# Patient Record
Sex: Male | Born: 1960 | Race: White | Hispanic: No | Marital: Married | Smoking: Never smoker
Health system: Southern US, Community
[De-identification: ages and names within clinical notes are randomized; demographics above are authoritative.]

## PROBLEM LIST (undated history)

## (undated) DIAGNOSIS — Z860100 Personal history of colon polyps, unspecified: Secondary | ICD-10-CM

## (undated) DIAGNOSIS — T7840XA Allergy, unspecified, initial encounter: Secondary | ICD-10-CM

## (undated) DIAGNOSIS — M1990 Unspecified osteoarthritis, unspecified site: Secondary | ICD-10-CM

## (undated) DIAGNOSIS — J069 Acute upper respiratory infection, unspecified: Secondary | ICD-10-CM

## (undated) DIAGNOSIS — E7800 Pure hypercholesterolemia, unspecified: Secondary | ICD-10-CM

## (undated) DIAGNOSIS — C801 Malignant (primary) neoplasm, unspecified: Secondary | ICD-10-CM

## (undated) HISTORY — DX: Malignant (primary) neoplasm, unspecified: C80.1

## (undated) HISTORY — PX: COLONOSCOPY: SHX174

## (undated) HISTORY — DX: Personal history of colon polyps, unspecified: Z86.0100

## (undated) HISTORY — DX: Personal history of colonic polyps: Z86.010

## (undated) HISTORY — DX: Acute upper respiratory infection, unspecified: J06.9

## (undated) HISTORY — DX: Allergy, unspecified, initial encounter: T78.40XA

## (undated) HISTORY — DX: Pure hypercholesterolemia, unspecified: E78.00

## (undated) HISTORY — PX: POLYPECTOMY: SHX149

## (undated) HISTORY — DX: Unspecified osteoarthritis, unspecified site: M19.90

---

## 1998-01-27 ENCOUNTER — Ambulatory Visit (HOSPITAL_COMMUNITY): Admission: RE | Admit: 1998-01-27 | Discharge: 1998-01-27 | Payer: Self-pay | Admitting: Neurosurgery

## 2000-12-04 ENCOUNTER — Encounter: Payer: Self-pay | Admitting: Orthopedic Surgery

## 2000-12-04 ENCOUNTER — Ambulatory Visit (HOSPITAL_COMMUNITY): Admission: RE | Admit: 2000-12-04 | Discharge: 2000-12-04 | Payer: Self-pay | Admitting: Orthopedic Surgery

## 2000-12-19 ENCOUNTER — Encounter: Admission: RE | Admit: 2000-12-19 | Discharge: 2000-12-19 | Payer: Self-pay | Admitting: Orthopedic Surgery

## 2000-12-19 ENCOUNTER — Encounter: Payer: Self-pay | Admitting: Orthopedic Surgery

## 2001-01-01 ENCOUNTER — Encounter: Admission: RE | Admit: 2001-01-01 | Discharge: 2001-01-01 | Payer: Self-pay | Admitting: Orthopedic Surgery

## 2001-01-01 ENCOUNTER — Encounter: Payer: Self-pay | Admitting: Orthopedic Surgery

## 2003-09-30 ENCOUNTER — Ambulatory Visit (HOSPITAL_COMMUNITY): Admission: RE | Admit: 2003-09-30 | Discharge: 2003-09-30 | Payer: Self-pay | Admitting: Orthopedic Surgery

## 2004-09-16 HISTORY — PX: ACHILLES TENDON REPAIR: SUR1153

## 2011-12-06 ENCOUNTER — Encounter: Payer: Self-pay | Admitting: Internal Medicine

## 2012-01-20 ENCOUNTER — Ambulatory Visit (AMBULATORY_SURGERY_CENTER): Payer: PRIVATE HEALTH INSURANCE | Admitting: *Deleted

## 2012-01-20 ENCOUNTER — Encounter: Payer: Self-pay | Admitting: Internal Medicine

## 2012-01-20 VITALS — Ht 71.0 in | Wt 183.0 lb

## 2012-01-20 DIAGNOSIS — Z1211 Encounter for screening for malignant neoplasm of colon: Secondary | ICD-10-CM

## 2012-01-20 MED ORDER — PEG-KCL-NACL-NASULF-NA ASC-C 100 G PO SOLR: ORAL | Status: DC

## 2012-02-03 ENCOUNTER — Encounter: Payer: Self-pay | Admitting: Internal Medicine

## 2012-03-12 ENCOUNTER — Ambulatory Visit (AMBULATORY_SURGERY_CENTER): Payer: PRIVATE HEALTH INSURANCE | Admitting: Internal Medicine

## 2012-03-12 ENCOUNTER — Encounter: Payer: Self-pay | Admitting: Internal Medicine

## 2012-03-12 VITALS — BP 117/69 | HR 63 | Temp 96.8°F | Resp 17 | Ht 71.0 in | Wt 183.0 lb

## 2012-03-12 DIAGNOSIS — D126 Benign neoplasm of colon, unspecified: Secondary | ICD-10-CM

## 2012-03-12 DIAGNOSIS — Z1211 Encounter for screening for malignant neoplasm of colon: Secondary | ICD-10-CM

## 2012-03-12 MED ORDER — SODIUM CHLORIDE 0.9 % IV SOLN: 500.0000 mL | INTRAVENOUS | Status: DC

## 2012-03-12 NOTE — Patient Instructions (Addendum)
YOU HAD AN ENDOSCOPIC PROCEDURE TODAY AT THE [facility]: Refer to the procedure report that was given to you for any specific questions about what was found during the examination.  If the procedure report does not answer your questions, please call your gastroenterologist to clarify.  If you requested that your care partner not be given the details of your procedure findings, then the procedure report has been included in a sealed envelope for you to review at your convenience later.  YOU SHOULD EXPECT: Some feelings of bloating in the abdomen. Passage of more gas than usual.  Walking can help get rid of the air that was put into your GI tract during the procedure and reduce the bloating. If you had a lower endoscopy (such as a colonoscopy or flexible sigmoidoscopy) you may notice spotting of blood in your stool or on the toilet paper. If you underwent a bowel prep for your procedure, then you may not have a normal bowel movement for a few days.  DIET: Your first meal following the procedure should be a light meal and then it is ok to progress to your normal diet.  A half-sandwich or bowl of soup is an example of a good first meal.  Heavy or fried foods are harder to digest and may make you feel nauseous or bloated.  Likewise meals heavy in dairy and vegetables can cause extra gas to form and this can also increase the bloating.  Drink plenty of fluids but you should avoid alcoholic beverages for 24 hours.  ACTIVITY: Your care partner should take you home directly after the procedure.  You should plan to take it easy, moving slowly for the rest of the day.  You can resume normal activity the day after the procedure however you should NOT DRIVE or use heavy machinery for 24 hours (because of the sedation medicines used during the test).    SYMPTOMS TO REPORT IMMEDIATELY: A gastroenterologist can be reached at any hour.  During normal business hours, 8:30 AM to 5:00 PM Monday through Friday,  call [phone].  After hours and on weekends, please call the GI answering service at [phone] who will take a message and have the physician on call contact you.   Following lower endoscopy (colonoscopy or flexible sigmoidoscopy):  Excessive amounts of blood in the stool  Significant tenderness or worsening of abdominal pains  Swelling of the abdomen that is new, acute  Fever of 100F or higher  Following upper endoscopy (EGD)  Vomiting of blood or coffee ground material  New chest pain or pain under the shoulder blades  Painful or persistently difficult swallowing  New shortness of breath  Fever of 100F or higher  Black, tarry-looking stools  FOLLOW UP: If any biopsies were taken you will be contacted by phone or by letter within the next 1-3 weeks.  Call your gastroenterologist if you have not heard about the biopsies in 3 weeks.  Our staff will call the home number listed on your records the next business day following your procedure to check on you and address any questions or concerns that you may have at that time regarding the information given to you following your procedure. This is a courtesy call and so if there is no answer at the home number and we have not heard from you through the emergency physician on call, we will assume that you have returned to your regular daily activities without incident.  SIGNATURES/CONFIDENTIALITY: You and/or your care   partner have signed paperwork which will be entered into your electronic medical record.  These signatures attest to the fact that that the information above on your After Visit Summary has been reviewed and is understood.  Full responsibility of the confidentiality of this discharge information lies with you and/or your care-partner.   Handouts on polyps, diverticulosis, high fiber diet  

## 2012-03-12 NOTE — Progress Notes (Signed)
Patient did not experience any of the following events: a burn prior to discharge; a fall within the facility; wrong site/side/patient/procedure/implant event; or a hospital transfer or hospital admission upon discharge from the facility. (G8907) Patient did not have preoperative order for IV antibiotic SSI prophylaxis. (G8918)  

## 2012-03-12 NOTE — Op Note (Signed)
[facility] [address] [place], [place]  [address]  COLONOSCOPY PROCEDURE REPORT  PATIENT:  Wesley Simmons, Wesley Simmons  MR#:  [id number] BIRTHDATE:  05-Jan-1961, 50 yrs. old  GENDER:  male ENDOSCOPIST:  Wilhemina Bonito. Eda Keys, MD REF. BY:  Geoffry Paradise, M.D. PROCEDURE DATE:  03/12/2012 PROCEDURE:  Colonoscopy with snare polypectomy x 1 ASA CLASS:  Class I INDICATIONS:  Routine Risk Screening MEDICATIONS:   MAC sedation, administered by CRNA, propofol (Diprivan) 400 mg IV  DESCRIPTION OF PROCEDURE:   After the risks benefits and alternatives of the procedure were thoroughly explained, informed consent was obtained.  Digital rectal exam was performed and revealed no abnormalities.   The LB CF-[id number] endoscope was introduced through the anus and advanced to the cecum, which was identified by both the appendix and ileocecal valve, without limitations.  The quality of the prep was excellent, using MoviPrep.  The instrument was then slowly withdrawn as the colon was fully examined. <<PROCEDUREIMAGES>>  FINDINGS:  A diminutive polyp was found in the ascending colon and snared without cautery. Retrieval was successful. A few scattered diverticula were found.  Otherwise normal colonoscopy without other polyps, masses, vascular ectasias, or inflammatory changes. Retroflexed views in the rectum revealed no abnormalities.    The time to cecum = 3:34  minutes. The scope was then withdrawn in 10:34  minutes from the cecum and the procedure completed.  COMPLICATIONS:  None  ENDOSCOPIC IMPRESSION: 1) Diminutive polyp in the ascending colon - removed 2) Diverticula, a few scattered 3) Otherwise normal colonoscopy  RECOMMENDATIONS: 1) Repeat colonoscopy in 5 years if polyp adenomatous; otherwise 10 years  ______________________________ Wilhemina Bonito. Eda Keys, MD  CC:  Geoffry Paradise, MD;  The Patient  n. eSIGNED:   Wilhemina Bonito. Eda Keys at 03/12/2012 08:50 AM  Maeder, Fayrene Fearing,  [phone]

## 2012-03-13 ENCOUNTER — Telehealth: Payer: Self-pay | Admitting: *Deleted

## 2012-03-13 NOTE — Telephone Encounter (Signed)
  Follow up Call-  Call back number 03/12/2012  Post procedure Call Back phone  # [phone]  Permission to leave phone message Yes    [facility]

## 2012-03-17 ENCOUNTER — Encounter: Payer: Self-pay | Admitting: Internal Medicine

## 2014-01-19 ENCOUNTER — Other Ambulatory Visit (HOSPITAL_COMMUNITY): Payer: Self-pay | Admitting: Orthopedic Surgery

## 2014-01-19 DIAGNOSIS — M5410 Radiculopathy, site unspecified: Secondary | ICD-10-CM

## 2014-01-19 DIAGNOSIS — M21372 Foot drop, left foot: Secondary | ICD-10-CM

## 2014-01-21 ENCOUNTER — Ambulatory Visit (HOSPITAL_COMMUNITY)
Admission: RE | Admit: 2014-01-21 | Discharge: 2014-01-21 | Disposition: A | Discharge status: Home / Self Care | Payer: PRIVATE HEALTH INSURANCE | Source: Ambulatory Visit | Attending: Orthopedic Surgery | Admitting: Orthopedic Surgery

## 2014-01-21 DIAGNOSIS — M51379 Other intervertebral disc degeneration, lumbosacral region without mention of lumbar back pain or lower extremity pain: Secondary | ICD-10-CM | POA: Insufficient documentation

## 2014-01-21 DIAGNOSIS — M545 Low back pain, unspecified: Secondary | ICD-10-CM | POA: Insufficient documentation

## 2014-01-21 DIAGNOSIS — M21372 Foot drop, left foot: Secondary | ICD-10-CM

## 2014-01-21 DIAGNOSIS — M5126 Other intervertebral disc displacement, lumbar region: Secondary | ICD-10-CM | POA: Insufficient documentation

## 2014-01-21 DIAGNOSIS — M5137 Other intervertebral disc degeneration, lumbosacral region: Secondary | ICD-10-CM | POA: Insufficient documentation

## 2014-01-21 DIAGNOSIS — M5410 Radiculopathy, site unspecified: Secondary | ICD-10-CM

## 2014-01-21 DIAGNOSIS — M5380 Other specified dorsopathies, site unspecified: Secondary | ICD-10-CM | POA: Insufficient documentation

## 2014-01-21 DIAGNOSIS — M48061 Spinal stenosis, lumbar region without neurogenic claudication: Secondary | ICD-10-CM | POA: Insufficient documentation

## 2014-01-21 DIAGNOSIS — R209 Unspecified disturbances of skin sensation: Secondary | ICD-10-CM | POA: Insufficient documentation

## 2016-11-25 ENCOUNTER — Other Ambulatory Visit (INDEPENDENT_AMBULATORY_CARE_PROVIDER_SITE_OTHER): Payer: Self-pay | Admitting: Orthopedic Surgery

## 2016-11-25 MED ORDER — PREDNISONE 10 MG PO TABS: 20.0000 mg | ORAL_TABLET | Freq: Every day | ORAL | 0 refills | Status: DC

## 2016-12-02 ENCOUNTER — Ambulatory Visit (INDEPENDENT_AMBULATORY_CARE_PROVIDER_SITE_OTHER): Payer: PRIVATE HEALTH INSURANCE

## 2016-12-02 ENCOUNTER — Ambulatory Visit (INDEPENDENT_AMBULATORY_CARE_PROVIDER_SITE_OTHER): Payer: PRIVATE HEALTH INSURANCE | Admitting: Family

## 2016-12-02 ENCOUNTER — Encounter (INDEPENDENT_AMBULATORY_CARE_PROVIDER_SITE_OTHER): Payer: Self-pay | Admitting: Orthopedic Surgery

## 2016-12-02 VITALS — Ht 71.0 in | Wt 183.0 lb

## 2016-12-02 DIAGNOSIS — M542 Cervicalgia: Secondary | ICD-10-CM | POA: Diagnosis not present

## 2016-12-02 NOTE — Progress Notes (Signed)
   Office Visit Note   Patient: Wesley Simmons           Date of Birth: [birth year]           MRN: [id number] Visit Date: 12/02/2016              Requested by: Burnard Bunting, MD [address] [place], [place] [address] PCP: Geoffery Lyons, MD  Chief Complaint  Patient presents with  . Neck - Pain    HPI: Patient states that he has been having neck pain "for a while" and that the pain is getting more intense and now has gone from radiating down the left side of his neck to his shoulder to now running down his arm to his index finger. Pamella Pert, RMA  The patient is a 56 year old gentleman who presents today with 3 weeks of worsening neck pain. This has been getting more intense. Has radicular pain down the left side of his neck shoulder down to his arm and index finger. This previously trialed prednisone with minimal relief at the start which has runoff. States pain is worsening. States feels like his left hand is weak.    Assessment & Plan: Visit Diagnoses:  1. Neck pain   2. Cervicalgia     Plan: Will proceed with MRI c spine. Follow up for review.  Follow-Up Instructions: Return for p mri c duda.   Back Exam   Tenderness  Back tenderness location: Cervical spine nontender.  Range of Motion  The patient has normal back ROM.  Comments:  Him pain with forward flexion as well as extension of the neck. Rotation and lateral movements not painful. spurling negative.       Imaging: No results found.  Labs: No results found for: HGBA1C, ESRSEDRATE, CRP, LABURIC, REPTSTATUS, GRAMSTAIN, CULT, LABORGA  Orders:  Orders Placed This Encounter  Procedures  . XR Cervical Spine 2 or 3 views  . MR Cervical Spine w/o contrast   No orders of the defined types were placed in this encounter.    Procedures: No procedures performed  Clinical Data: No additional findings.  Subjective: Review of Systems  Constitutional: Negative for chills and fever.    Musculoskeletal: Positive for neck pain. Negative for neck stiffness.  Neurological: Positive for weakness and numbness.    Objective: Vital Signs: Ht 5\' 11"  (1.803 m)   Wt 183 lb (83 kg)   BMI 25.52 kg/m   Specialty Comments:  No specialty comments available.  PMFS History: There are no active problems to display for this patient.  Past Medical History:  Diagnosis Date  . 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  Past Surgical History:  Procedure Laterality Date  . ACHILLES TENDON REPAIR  2006   right   Social History   Occupational History  . Not on file.   Social History Main Topics  . Smoking status: Never Smoker  . Smokeless tobacco: Never Used  . Alcohol use 1.8 oz/week    2 Glasses of wine, 1 Cans of beer per week  . Drug use: No  . Sexual activity: Not on file

## 2016-12-13 ENCOUNTER — Other Ambulatory Visit: Payer: PRIVATE HEALTH INSURANCE

## 2016-12-29 ENCOUNTER — Ambulatory Visit
Admission: RE | Admit: 2016-12-29 | Discharge: 2016-12-29 | Disposition: A | Discharge status: Home / Self Care | Payer: PRIVATE HEALTH INSURANCE | Source: Ambulatory Visit | Attending: Family | Admitting: Family

## 2016-12-29 DIAGNOSIS — M542 Cervicalgia: Secondary | ICD-10-CM

## 2016-12-30 ENCOUNTER — Encounter: Payer: Self-pay | Admitting: Internal Medicine

## 2016-12-30 ENCOUNTER — Telehealth (INDEPENDENT_AMBULATORY_CARE_PROVIDER_SITE_OTHER): Payer: Self-pay | Admitting: Orthopedic Surgery

## 2016-12-30 NOTE — Telephone Encounter (Signed)
Called patient left message on voicemail to return call to schedule appointment for MRI review   [phone]

## 2017-01-06 ENCOUNTER — Encounter (INDEPENDENT_AMBULATORY_CARE_PROVIDER_SITE_OTHER): Payer: Self-pay | Admitting: Orthopedic Surgery

## 2017-01-06 ENCOUNTER — Ambulatory Visit (INDEPENDENT_AMBULATORY_CARE_PROVIDER_SITE_OTHER): Payer: PRIVATE HEALTH INSURANCE | Admitting: Orthopedic Surgery

## 2017-01-06 VITALS — Ht 71.0 in | Wt 183.0 lb

## 2017-01-06 DIAGNOSIS — M4722 Other spondylosis with radiculopathy, cervical region: Secondary | ICD-10-CM

## 2017-01-06 DIAGNOSIS — M4712 Other spondylosis with myelopathy, cervical region: Secondary | ICD-10-CM

## 2017-01-06 NOTE — Progress Notes (Signed)
   Office Visit Note   Patient: Wesley Simmons           Date of Birth: [birth year]           MRN: [id number] Visit Date: 01/06/2017              Requested by: Burnard Bunting, MD [address] [place], [place] [address] PCP: Geoffery Lyons, MD  Chief Complaint  Patient presents with  . Neck - Follow-up    C spine MRI review      HPI: Patient is a 56 year old gentleman who still has radicular pain left upper extremity he states sometimes he notices some fatigue weakness in his grip strength left hand his radicular pain that radiates down to his index finger left upper extremity. Patient has tried a prednisone Dosepak as well as traction.  Assessment & Plan: Visit Diagnoses:  1. 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 Ernestina Patches. Again recommended the importance of traction.   Follow-Up Instructions: Return if symptoms worsen or fail to improve.   Ortho Exam  Patient is alert, oriented, no adenopathy, well-dressed, normal affect, normal respiratory effort. On examination patient has a normal gait. He has decreased range of motion of the cervical spine. Patient has no focal motor weakness in either upper extremity but he does report some fatigue weakness in the left hand grip strength and radicular pain down his left index finger.  Imaging: No results found.  Labs: No results found for: HGBA1C, ESRSEDRATE, CRP, LABURIC, REPTSTATUS, GRAMSTAIN, CULT, LABORGA  Orders:  Orders Placed This Encounter  Procedures  . Ambulatory referral to Physical Medicine Rehab   No orders of the defined types were placed in this encounter.    Procedures: No procedures performed  Clinical Data: No additional findings.  ROS:  All other systems negative, except as noted in the HPI. Review of Systems  Objective: Vital Signs: Ht 5\' 11"  (1.803 m)   Wt 183 lb (83 kg)   BMI 25.52 kg/m    Specialty Comments:  No specialty comments available.  PMFS History: Patient Active Problem List   Diagnosis Date Noted  . 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  Past Surgical History:  Procedure Laterality Date  . ACHILLES TENDON REPAIR  2006   right   Social History   Occupational History  . Not on file.   Social History Main Topics  . Smoking status: Never Smoker  . Smokeless tobacco: Never Used  . Alcohol use 1.8 oz/week    2 Glasses of wine, 1 Cans of beer per week  . Drug use: No  . Sexual activity: Not on file

## 2017-01-30 ENCOUNTER — Other Ambulatory Visit (INDEPENDENT_AMBULATORY_CARE_PROVIDER_SITE_OTHER): Payer: Self-pay

## 2017-01-30 DIAGNOSIS — M542 Cervicalgia: Secondary | ICD-10-CM

## 2017-03-05 ENCOUNTER — Encounter (INDEPENDENT_AMBULATORY_CARE_PROVIDER_SITE_OTHER): Payer: Self-pay

## 2017-08-16 DIAGNOSIS — J069 Acute upper respiratory infection, unspecified: Secondary | ICD-10-CM

## 2017-08-16 HISTORY — DX: Acute upper respiratory infection, unspecified: J06.9

## 2017-08-22 ENCOUNTER — Encounter: Payer: Self-pay | Admitting: Internal Medicine

## 2017-09-03 ENCOUNTER — Other Ambulatory Visit: Payer: Self-pay

## 2017-09-03 ENCOUNTER — Ambulatory Visit (AMBULATORY_SURGERY_CENTER): Payer: Self-pay | Admitting: *Deleted

## 2017-09-03 VITALS — Ht 71.0 in | Wt 183.0 lb

## 2017-09-03 DIAGNOSIS — Z86010 Personal history of colonic polyps: Secondary | ICD-10-CM

## 2017-09-03 MED ORDER — NA SULFATE-K SULFATE-MG SULF 17.5-3.13-1.6 GM/177ML PO SOLN: 1.0000 | Freq: Once | ORAL | 0 refills | Status: AC

## 2017-09-03 NOTE — Progress Notes (Signed)
No egg or soy allergy known to patient  No issues with past sedation with any surgeries  or procedures, no intubation problems  No diet pills per patient No home 02 use per patient  No blood thinners per patient  Pt denies issues with constipation  No A fib or A flutter  EMMI video sent to pt's e mail - pt declined Informed pt in PV he will have a copay for suprep- $15 coupon to pt in PV today

## 2017-09-24 ENCOUNTER — Telehealth: Payer: Self-pay | Admitting: Internal Medicine

## 2017-09-24 DIAGNOSIS — Z86010 Personal history of colonic polyps: Secondary | ICD-10-CM

## 2017-09-24 MED ORDER — NA SULFATE-K SULFATE-MG SULF 17.5-3.13-1.6 GM/177ML PO SOLN: 1.0000 | Freq: Once | ORAL | 0 refills | Status: AC

## 2017-09-24 NOTE — Telephone Encounter (Signed)
Suprep bowel prep was sent over to [place]. Left a message for the pt to call back if he has any other questions or problems. Gwyndolyn Saxon

## 2017-09-30 ENCOUNTER — Other Ambulatory Visit: Payer: Self-pay

## 2017-09-30 ENCOUNTER — Ambulatory Visit (AMBULATORY_SURGERY_CENTER): Payer: PRIVATE HEALTH INSURANCE | Admitting: Internal Medicine

## 2017-09-30 ENCOUNTER — Encounter: Payer: Self-pay | Admitting: Internal Medicine

## 2017-09-30 VITALS — BP 114/93 | HR 65 | Temp 97.3°F | Resp 13 | Ht 71.0 in | Wt 183.0 lb

## 2017-09-30 DIAGNOSIS — Z86010 Personal history of colonic polyps: Secondary | ICD-10-CM

## 2017-09-30 MED ORDER — SODIUM CHLORIDE 0.9 % IV SOLN
500.0000 mL | Freq: Once | INTRAVENOUS | Status: DC
Start: 2017-09-30 — End: 2017-09-30

## 2017-09-30 NOTE — Progress Notes (Signed)
Pt's states no medical or surgical changes since previsit or office visit.Patient consents to observer being present for procedure. Patient consents to observer being present for procedure. No allergies to soy or eggs.

## 2017-09-30 NOTE — Progress Notes (Signed)
To PACU, VSS. Report to [email or web address] 

## 2017-09-30 NOTE — Patient Instructions (Signed)
YOU HAD AN ENDOSCOPIC PROCEDURE TODAY AT THE [facility]:   Refer to the procedure report that was given to you for any specific questions about what was found during the examination.  If the procedure report does not answer your questions, please call your gastroenterologist to clarify.  If you requested that your care partner not be given the details of your procedure findings, then the procedure report has been included in a sealed envelope for you to review at your convenience later.  YOU SHOULD EXPECT: Some feelings of bloating in the abdomen. Passage of more gas than usual.  Walking can help get rid of the air that was put into your GI tract during the procedure and reduce the bloating. If you had a lower endoscopy (such as a colonoscopy or flexible sigmoidoscopy) you may notice spotting of blood in your stool or on the toilet paper. If you underwent a bowel prep for your procedure, you may not have a normal bowel movement for a few days.  Please Note:  You might notice some irritation and congestion in your nose or some drainage.  This is from the oxygen used during your procedure.  There is no need for concern and it should clear up in a day or so.  SYMPTOMS TO REPORT IMMEDIATELY:   Following lower endoscopy (colonoscopy or flexible sigmoidoscopy):  Excessive amounts of blood in the stool  Significant tenderness or worsening of abdominal pains  Swelling of the abdomen that is new, acute  Fever of 100F or higher   For urgent or emergent issues, a gastroenterologist can be reached at any hour by calling [phone].   DIET:  We do recommend a small meal at first, but then you may proceed to your regular diet.  Drink plenty of fluids but you should avoid alcoholic beverages for 24 hours.  ACTIVITY:  You should plan to take it easy for the rest of today and you should NOT DRIVE or use heavy machinery until tomorrow (because of the sedation medicines used during the test).     FOLLOW UP: Our staff will call the number listed on your records the next business day following your procedure to check on you and address any questions or concerns that you may have regarding the information given to you following your procedure. If we do not reach you, we will leave a message.  However, if you are feeling well and you are not experiencing any problems, there is no need to return our call.  We will assume that you have returned to your regular daily activities without incident.  If any biopsies were taken you will be contacted by phone or by letter within the next 1-3 weeks.  Please call us at [phone] if you have not heard about the biopsies in 3 weeks.    SIGNATURES/CONFIDENTIALITY: You and/or your care partner have signed paperwork which will be entered into your electronic medical record.  These signatures attest to the fact that that the information above on your After Visit Summary has been reviewed and is understood.  Full responsibility of the confidentiality of this discharge information lies with you and/or your care-partner.   Resume medications. Information given on hemorrhoids and diverticulosis. 

## 2017-09-30 NOTE — Op Note (Signed)
[facility] Patient Name: Wesley Simmons Procedure Date: 09/30/2017 3:51 PM MRN: [id number] Endoscopist: Docia Chuck. Henrene Pastor , MD Age: 57 Referring MD:  Date of Birth: Oct 04, 1960 Gender: Male Account #: [id number] Procedure:                Colonoscopy Indications:              High risk colon cancer surveillance: Personal                            history of non-advanced adenoma. Index exam July                            2013 Medicines:                Monitored Anesthesia Care Procedure:                Pre-Anesthesia Assessment:                           - Prior to the procedure, a History and Physical                            was performed, and patient medications and                            allergies were reviewed. The patient's tolerance of                            previous anesthesia was also reviewed. The risks                            and benefits of the procedure and the sedation                            options and risks were discussed with the patient.                            All questions were answered, and informed consent                            was obtained. Prior Anticoagulants: The patient has                            taken no previous anticoagulant or antiplatelet                            agents. ASA Grade Assessment: I - A normal, healthy                            patient. After reviewing the risks and benefits,                            the patient was deemed in satisfactory condition to  undergo the procedure.                           After obtaining informed consent, the colonoscope                            was passed under direct vision. Throughout the                            procedure, the patient's blood pressure, pulse, and                            oxygen saturations were monitored continuously. The                            Colonoscope was introduced through the anus and     advanced to the the cecum, identified by                            appendiceal orifice and ileocecal valve. The                            ileocecal valve, appendiceal orifice, and rectum                            were photographed. The quality of the bowel                            preparation was excellent. The colonoscopy was                            performed without difficulty. The patient tolerated                            the procedure well. The bowel preparation used was                            SUPREP. Scope In: 3:55:38 PM Scope Out: 4:10:23 PM Scope Withdrawal Time: 0 hours 11 minutes 27 seconds  Total Procedure Duration: 0 hours 14 minutes 45 seconds  Findings:                 A few diverticula were found in the sigmoid colon.                           Internal hemorrhoids were found during                            retroflexion. The hemorrhoids were small.                           The exam was otherwise without abnormality on                            direct and retroflexion views. Complications:  No immediate complications. Estimated blood loss:                            None. Estimated Blood Loss:     Estimated blood loss: none. Impression:               - Diverticulosis in the sigmoid colon.                           - Internal hemorrhoids.                           - The examination was otherwise normal on direct                            and retroflexion views.                           - No specimens collected. Recommendation:           - Repeat colonoscopy in 10 years for surveillance.                           - Patient has a contact number available for                            emergencies. The signs and symptoms of potential                            delayed complications were discussed with the                            patient. Return to normal activities tomorrow.                            Written discharge instructions were  provided to the                            patient.                           - Resume previous diet.                           - Continue present medications. Docia Chuck. Henrene Pastor, MD 09/30/2017 4:14:13 PM This report has been signed electronically.

## 2017-10-01 ENCOUNTER — Telehealth: Payer: Self-pay | Admitting: *Deleted

## 2017-10-01 NOTE — Telephone Encounter (Signed)
No answer, left message to call if questions or concerns. 

## 2017-10-01 NOTE — Telephone Encounter (Signed)
  Follow up Call-  Call back number 09/30/2017  Post procedure Call Back phone  # [phone]  Permission to leave phone message Yes  Some recent data might be hidden     Patient questions:  Do you have a fever, pain , or abdominal swelling? No. Pain Score  0 *  Have you tolerated food without any problems? Yes.    Have you been able to return to your normal activities? Yes.    Do you have any questions about your discharge instructions: Diet   No. Medications  No. Follow up visit  No.  Do you have questions or concerns about your Care? No.  Actions: * If pain score is 4 or above Pain less than 4, MD not notified

## 2018-03-26 IMAGING — MR MR CERVICAL SPINE W/O CM
4 of 5 series · 25 of 48 positions shown · non-contrast
Comparison: Radiographs dated 12/02/2016

CLINICAL DATA: Neck pain and stiffness radiating down the left arm
starting 6 weeks ago.

EXAM:
MRI CERVICAL SPINE WITHOUT CONTRAST
TECHNIQUE: Multiplanar, multisequence MR imaging of the cervical spine was
performed. No intravenous contrast was administered.

[Series 4: T1 · sagittal · 3.3mm · 0.41mm/px · 6 of 13 slices shown]
[im 1/13]
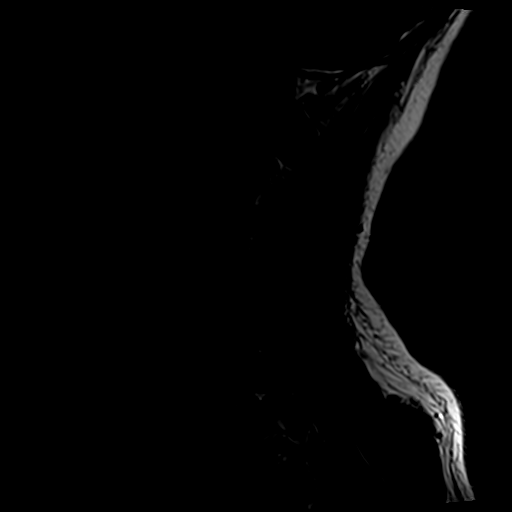
[im 3/13]
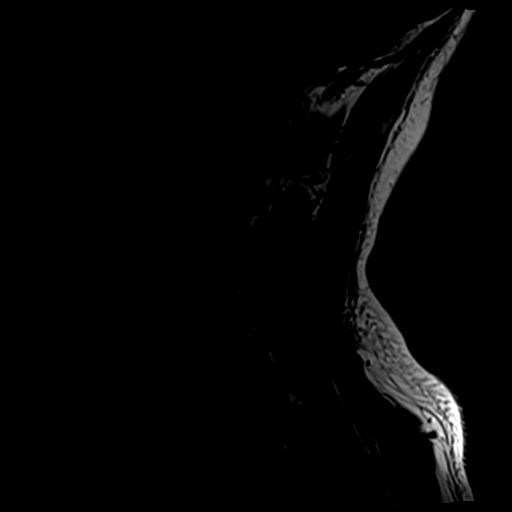
[im 5/13]
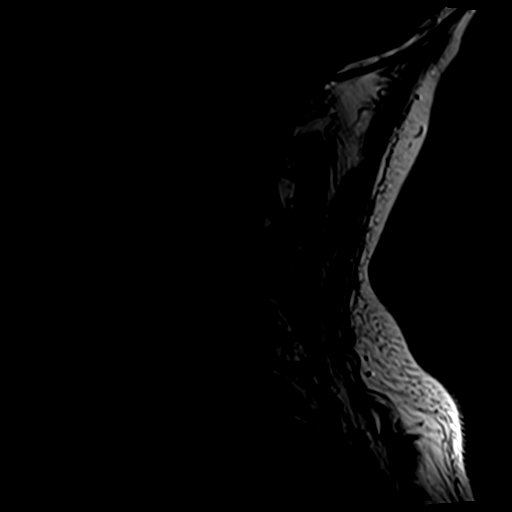
[im 7/13]
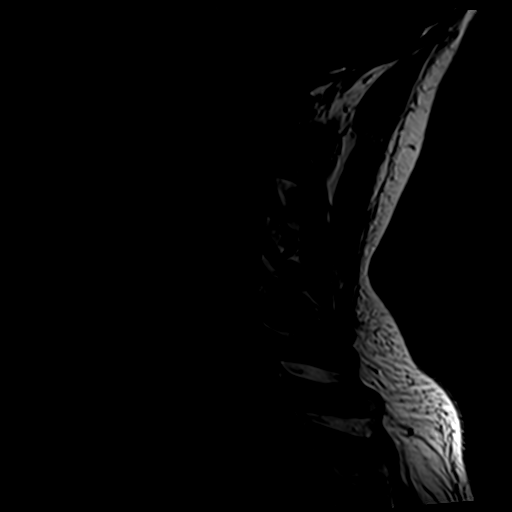
[im 9/13]
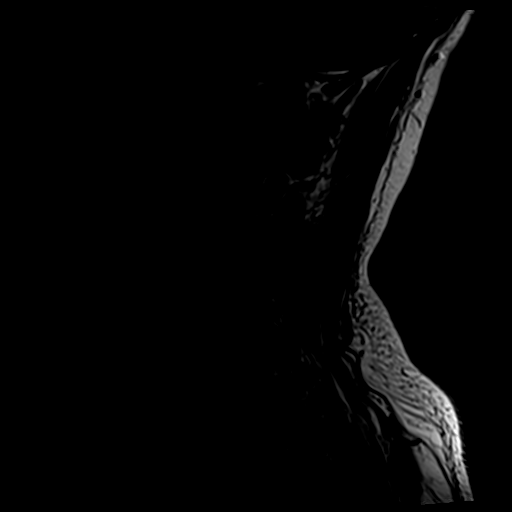
[im 11/13]
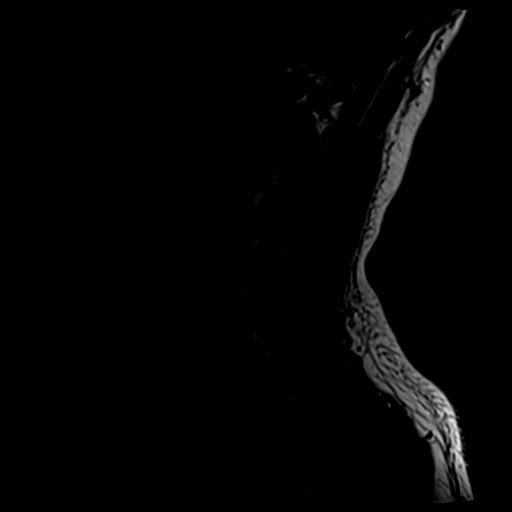

[Series 5: tir sag · sagittal · 3.3mm · 0.43mm/px · 3 of 13 slices shown]
[im 3/13]
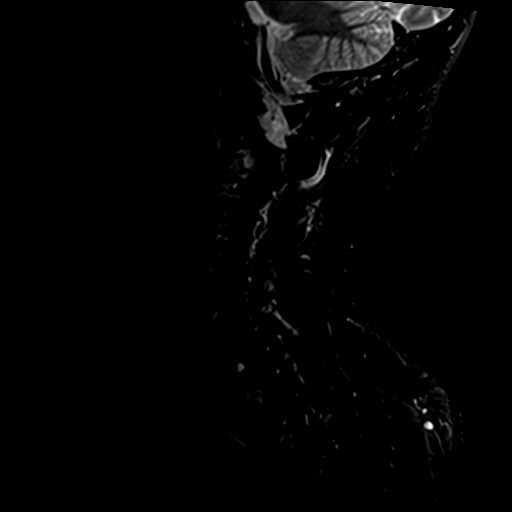
[im 7/13]
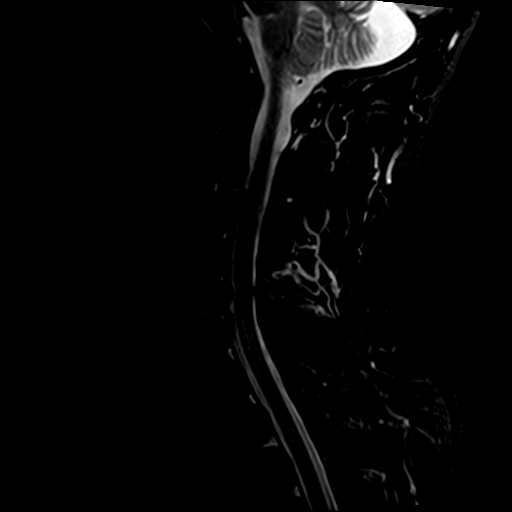
[im 11/13]
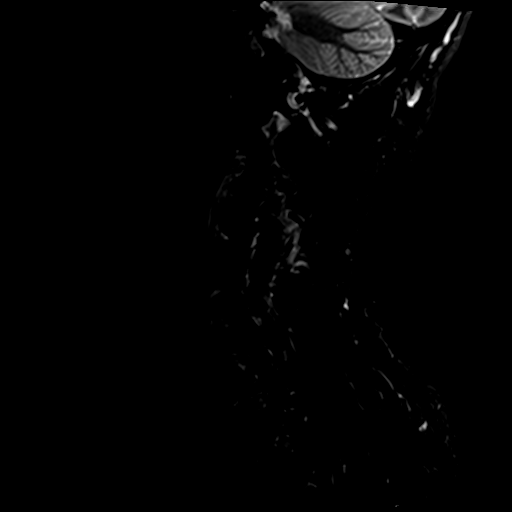

[Series 6: T2 · sagittal · 3.3mm · 0.41mm/px · 7 of 13 slices shown (1 of 2)]
[im 1/13]
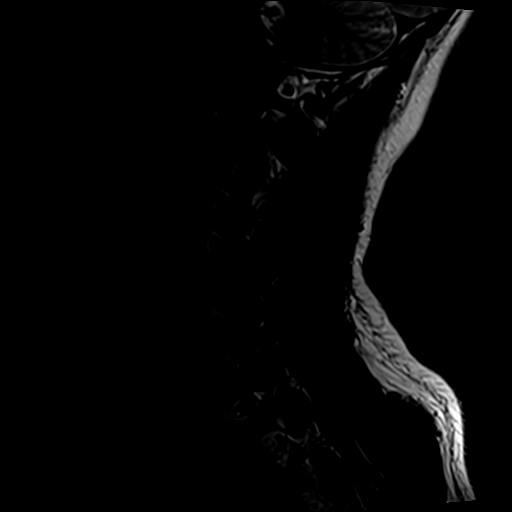
[im 3/13]
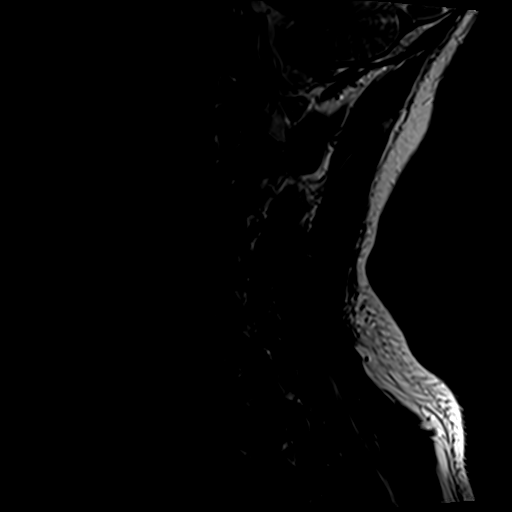
[im 5/13]
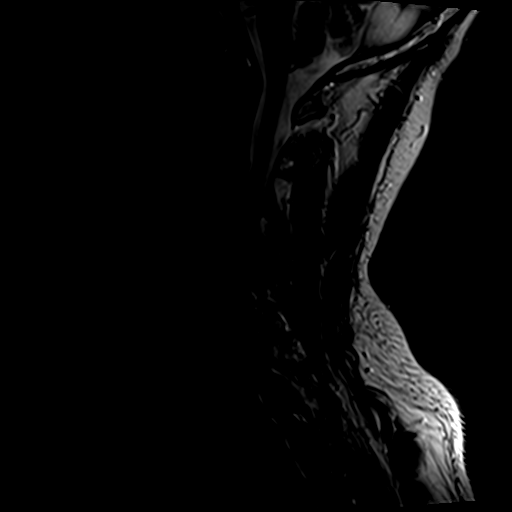
[im 7/13]
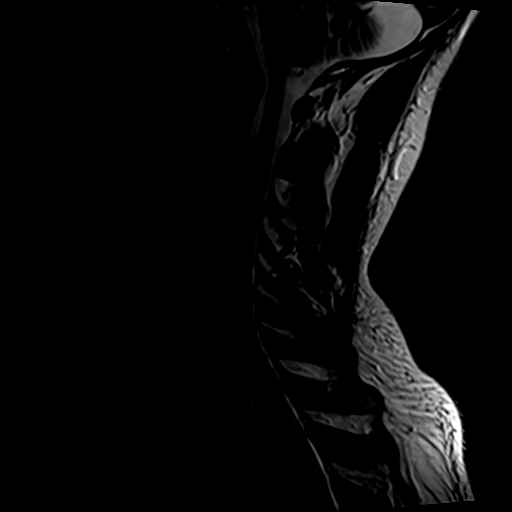
[im 9/13]
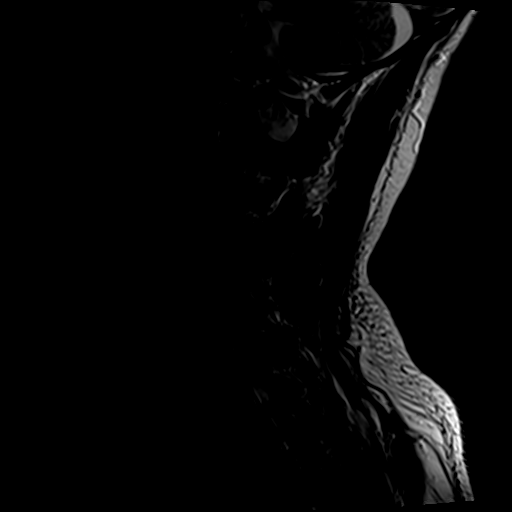
[im 11/13]
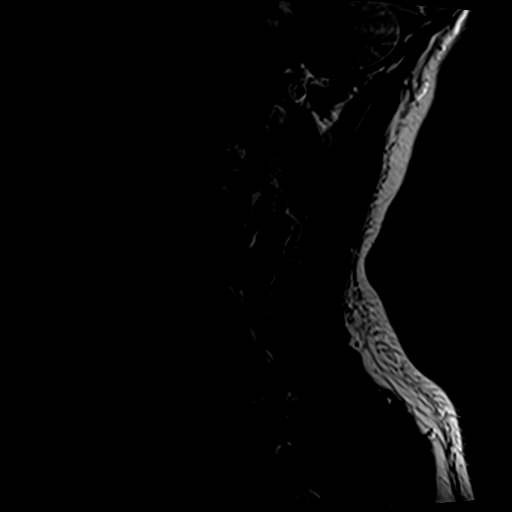
[im 13/13]
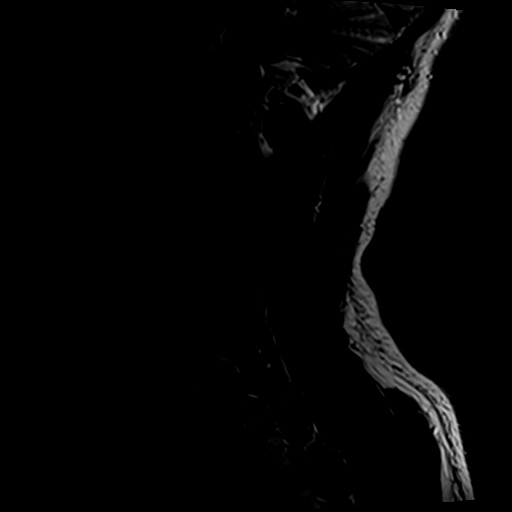

[Series 8: T2 · axial · 3.0mm · 0.70mm/px · z∈[-74,+22]mm · 9 of 24 slices shown (2 of 2)]
[im 1/24]
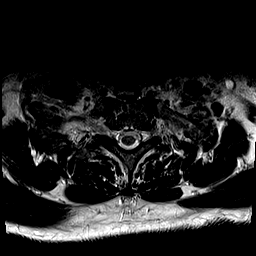
[im 4/24]
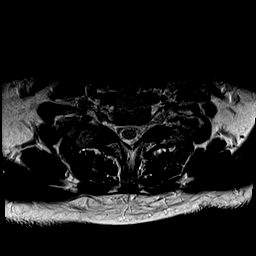
[im 8/24]
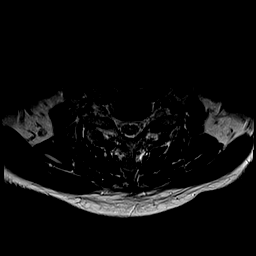
[im 10/24]
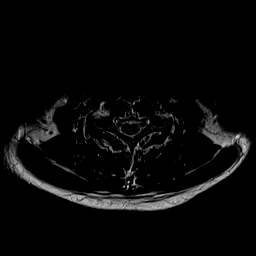
[im 12/24]
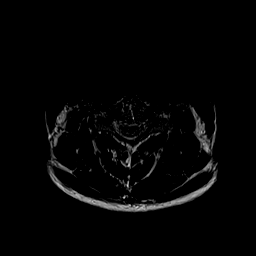
[im 14/24]
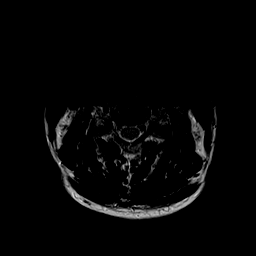
[im 16/24]
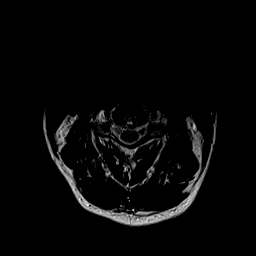
[im 20/24]
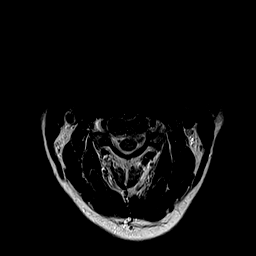
[im 24/24]
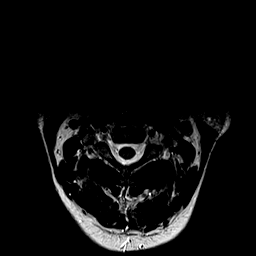

[25 of 48 positions shown; findings below may reference images not displayed]

FINDINGS: Despite efforts by the technologist and patient, motion artifact is
present on today's exam and could not be eliminated. This reduces
exam sensitivity and specificity.

Alignment: 1.5 mm degenerative retrolisthesis at C3-4 and C4-5.

Vertebrae: No significant vertebral marrow edema is identified.

Cord: No significant abnormal spinal cord signal is observed.

Posterior Fossa, vertebral arteries, paraspinal tissues: Mega
cisterna magna.

Disc levels:

C2-3:  No impingement, bilateral uncinate spurring.

C3-4: Moderate to prominent right and mild left foraminal stenosis
and mild central narrowing of the thecal sac due to disc bulge and
right greater than left uncinate spurring.

C4-5: Moderate left and mild right foraminal stenosis with
borderline central narrowing of the thecal sac due to disc bulge
along with uncinate and facet spurring.

C5-6: Prominent bilateral foraminal stenosis and moderate central
narrowing of the thecal sac due to disc bulge and considerable
bilateral uncinate spurring.

C6-7: Mild right foraminal stenosis due to uncinate and facet
spurring.

C7-T1:  Unremarkable.
IMPRESSION: 1. Cervical spondylosis and degenerative disc disease, causing
prominent impingement at C5-6; moderate to prominent impingement at
C3-4; moderate impingement at C4-5; and mild impingement at C6-7, as
detailed above.

## 2020-01-03 ENCOUNTER — Other Ambulatory Visit: Payer: Self-pay

## 2020-01-03 ENCOUNTER — Encounter (INDEPENDENT_AMBULATORY_CARE_PROVIDER_SITE_OTHER): Payer: Self-pay | Admitting: Ophthalmology

## 2020-01-03 ENCOUNTER — Ambulatory Visit (INDEPENDENT_AMBULATORY_CARE_PROVIDER_SITE_OTHER): Payer: PRIVATE HEALTH INSURANCE | Admitting: Ophthalmology

## 2020-01-03 DIAGNOSIS — H35712 Central serous chorioretinopathy, left eye: Secondary | ICD-10-CM | POA: Insufficient documentation

## 2020-01-03 DIAGNOSIS — S0501XA Injury of conjunctiva and corneal abrasion without foreign body, right eye, initial encounter: Secondary | ICD-10-CM

## 2020-01-03 DIAGNOSIS — H2511 Age-related nuclear cataract, right eye: Secondary | ICD-10-CM | POA: Diagnosis not present

## 2020-01-03 DIAGNOSIS — H2512 Age-related nuclear cataract, left eye: Secondary | ICD-10-CM | POA: Diagnosis not present

## 2020-01-03 DIAGNOSIS — H348122 Central retinal vein occlusion, left eye, stable: Secondary | ICD-10-CM | POA: Diagnosis not present

## 2020-01-03 DIAGNOSIS — H3562 Retinal hemorrhage, left eye: Secondary | ICD-10-CM

## 2020-01-03 DIAGNOSIS — H35722 Serous detachment of retinal pigment epithelium, left eye: Secondary | ICD-10-CM

## 2020-01-03 DIAGNOSIS — H43812 Vitreous degeneration, left eye: Secondary | ICD-10-CM

## 2020-01-03 HISTORY — DX: Retinal hemorrhage, left eye: H35.62

## 2020-01-03 HISTORY — DX: Injury of conjunctiva and corneal abrasion without foreign body, right eye, initial encounter: S05.01XA

## 2020-01-03 MED ORDER — TIMOLOL MALEATE 0.5 % OP SOLN: 1.0000 [drp] | Freq: Every day | OPHTHALMIC | Status: DC

## 2020-01-03 MED ORDER — TIMOLOL MALEATE 0.25 % OP SOLN: 1.0000 [drp] | Freq: Two times a day (BID) | OPHTHALMIC | 11 refills | Status: DC

## 2020-01-03 MED ORDER — TIMOLOL MALEATE 0.25 % OP SOLN: 1.0000 [drp] | Freq: Every day | OPHTHALMIC | 1 refills | Status: DC

## 2020-01-03 NOTE — Progress Notes (Signed)
01/03/2020     CHIEF COMPLAINT Patient presents for Flashes/floaters   HISTORY OF PRESENT ILLNESS: Wesley Simmons is a 59 y.o. male who presents to the clinic today for:   HPI    Flashes/floaters    In left eye.  This started 2 weeks ago.  Duration Intermittant.  Characterized as spots.  Since onset it is gradually worsening.  Associated Symptoms Flashes and Floaters.  Context:  near vision.  Response to treatment was no improvement.  I, the attending physician,  performed the HPI with the patient and updated documentation appropriately.          Comments    Pt c/o floaters and FOL in OS. Pt started seeing mild floaters in OS about 2 weeks ago and over time they have increased in number. Pt states 1 week ago he started noticing FOL in OS. Pt states he sees the flashes when he turns his head suddenly to the left. Flashes are in the outside corner of OS. Pt states OS NVA is more blurry than OD.        Last edited by Hurman Horn, MD on 01/03/2020  8:59 AM. (History)      Referring physician: Burnard Bunting, MD [place],  [place] [id number]  HISTORICAL INFORMATION:   Selected notes from the MEDICAL RECORD NUMBER       CURRENT MEDICATIONS: Current Outpatient Medications (Ophthalmic Drugs)  Medication Sig  . timolol (TIMOPTIC) 0.25 % ophthalmic solution Place 1 drop into the left eye daily.   No current facility-administered medications for this visit. (Ophthalmic Drugs)   Current Outpatient Medications (Other)  Medication Sig  . Multiple Vitamins-Minerals (CENTRUM SILVER PO) Take 1 tablet by mouth as needed.  Marland Kitchen aspirin 81 MG tablet Take 81 mg by mouth as needed.  . influenza vac split quadrivalent PF (AFLURIA QUADRIVALENT) 0.5 ML injection ADM 0.5ML IM UTD  . vitamin B-12 (CYANOCOBALAMIN) 1000 MCG tablet Take 1,000 mcg by mouth as needed.   No current facility-administered medications for this visit. (Other)      REVIEW OF  SYSTEMS:    ALLERGIES No Known Allergies  PAST MEDICAL HISTORY Past Medical History:  Diagnosis Date  . Allergy    mild congestion  . Arthritis   . 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 History of colon polyps   . Upper respiratory infection 08/2017   Past Surgical History:  Procedure Laterality Date  . ACHILLES TENDON REPAIR  2006   right  . COLONOSCOPY    . POLYPECTOMY      FAMILY HISTORY Family History  Problem Relation Age of Onset  . Breast cancer Mother   . Colon polyps Father   . Colon cancer Paternal Grandfather   . Esophageal cancer Neg Hx   . Rectal cancer Neg Hx   . Stomach cancer Neg Hx     SOCIAL HISTORY Social History   Tobacco Use  . Smoking status: Never Smoker  . Smokeless tobacco: Never Used  Substance Use Topics  . Alcohol use: Yes    Alcohol/week: 3.0 standard drinks    Types: 2 Glasses of wine, 1 Cans of beer per week  . Drug use: No         OPHTHALMIC EXAM:  Base Eye Exam    Visual Acuity (Snellen - Linear)      Right Left   Dist [place] 20/20 20/20       Tonometry (Tonopen, 8:32 AM)  Right Left   Pressure 17 18       Pupils      Pupils Dark Light Shape React APD   Right PERRL 5 3 Round Brisk None   Left PERRL 5 3 Round Brisk None       Visual Fields (Counting fingers)      Left Right    Full Full       Neuro/Psych    Oriented x3: Yes   Mood/Affect: Normal       Dilation    Both eyes: 1.0% Mydriacyl, 2.5% Phenylephrine @ 8:32 AM        Slit Lamp and Fundus Exam    External Exam      Right Left   External Normal Normal       Slit Lamp Exam      Right Left   Lids/Lashes Normal Normal   Conjunctiva/Sclera White and quiet White and quiet   Cornea Clear Clear   Anterior Chamber Deep and quiet Deep and quiet   Iris Round and reactive Round and reactive   Lens 1+ Nuclear sclerosis 1+ Nuclear sclerosis   Anterior Vitreous Normal Normal       Fundus Exam      Right Left   Posterior Vitreous  Normal Posterior vitreous detachment   Disc Normal Collaterals, these are not neovascularization   C/D Ratio 0.5 0.45   Macula Normal Normal, no cystoid macular edema   Vessels Normal Tortuous,, and slightly engorged at the optic nerve.  , Peripheral dot blot hemorrhages are noted temporally and inferiorly largely along the inferior hemiretinal vein   Periphery Normal Normal, no holes or tears          IMAGING AND PROCEDURES  Imaging and Procedures for @TODAY @  OCT, Retina - OU - Both Eyes       Right Eye Quality was good. Scan locations included subfoveal. Findings include normal observations.   Left Eye Quality was good. Scan locations included subfoveal. Findings include normal foveal contour, pigment epithelial detachment.   Notes OS with posterior vitreous detachment,, small old pigment epithelial detachment superior portion of macula       Color Fundus Photography Optos - OU - Both Eyes       Right Eye Disc findings include normal observations.   Left Eye Macula : drusen. Periphery : hemorrhage.   Notes OS with early collateralization on the optic nerve from incomplete central retinal vein occlusion.,  No cystoid macular edema.  Tortuous vessels throughout the posterior pole.,  Peripheral dot and blot hemorrhages.  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 Mid/Late phase findings include normal observations.   Left Eye   Early phase findings include window defect, leakage. Mid/Late phase findings include delayed filling. Choroidal neovascularization is not present.   Notes OS with early collateralization on the optic nerve from incomplete central retinal vein occlusion.,  No cystoid macular edema.  Tortuous vessels throughout the  posterior pole.,  Peripheral dot and blot hemorrhages.  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      ICD-10-CM   1. Central retinal vein occlusion of left eye, unspecified complication status  99991111 OCT, Retina - OU - Both Eyes    Color Fundus Photography Optos - OU - Both Eyes    Fluorescein Angiography Optos (Transit OS)  2. Nuclear sclerotic cataract of right eye  H25.11   3. Nuclear sclerotic cataract of left eye  H25.12   4. Retinal hemorrhage of left eye  H35.62   5. Serous detachment of retinal pigment epithelium of left eye  H35.722 OCT, Retina - OU - Both Eyes  6. Posterior vitreous detachment of left eye  H43.812     1.  2.  3.  Ophthalmic Meds Ordered this visit:  Meds ordered this encounter  Medications  . DISCONTD: timolol (TIMOPTIC) 0.5 % ophthalmic solution 1 drop  . DISCONTD: timolol (TIMOPTIC) 0.25 % ophthalmic solution    Sig: Place 1 drop into the left eye 2 (two) times daily.    Dispense:  3 mL    Refill:  11  . timolol (TIMOPTIC) 0.25 % ophthalmic solution    Sig: Place 1 drop into the left eye daily.    Dispense:  5 mL    Refill:  1       Return in about 7 weeks (around 02/21/2020) for No dilated, OCT, COLOR FP.  Patient Instructions  The nature of central retinal vein occlusion was discussed with the patient including the division of types into nonischemic ischemic. The potential sequelae of ischemic central retinal vein occlusion, including macular edema, neovascularization, rubeosis iridis, and neovascular glaucoma, were discussed, and the need for frequent follow-up.  The nature of macular edema and central retinal vein occlusion was discussed. The following options were considered:  1.Observation for a period to look  for spontaneous improvement, is no linger the primary therapy. One-third worsen, one-third stay unchanged, and one-third improves.  2. Anti-VEGF Therapy. ( Lucentis, Avastin or Eylea ) injected  in intravitreal fashion, initially monthly then tailored to clinical response.  3. Intravitreal steroid usage, Kenalog, or Ozurdex, usually a second line therapy or in combination with anti-Vegf therapy noted above.  4. Panretinal laser photocoagulation to cause regression of iris neovascularization, or treat retinal  non-perfusion.  5. Surgical Management may include vitrectomy with incisions of peripheral veins to trigger retino choroidal anastomosis formation. This topic presented and discussed at [place].  OS currently with incomplete central retinal vein occlusion and significant distal peripheral dot and blot retinal hemorrhages.  Early collateralization on the optic nerve is noted thus suggesting some intermittent chronicity.  For this reason in the absence of cystoid macular edema, we will simply treat topically in an attempt to lower the intraocular pressure to allow for improved central retinal artery transit time and hopefully lessening of the is a incomplete central retinal vein occlusion.    Central Retinal Vein Occlusion, Adult  Central retinal vein occlusion (CRVO) is a blockage in the main blood vessel that carries blood away from the retina (central retinal vein). The retina is the part of your eye that senses light and sends signals to the brain that allow you to see. CRVO can cause blurry vision. It can also cause complete or partial vision loss. The condition usually affects only one eye. What are the causes? This condition is usually caused by a blood clot that forms inside the central retinal vein. A clot is blood that has thickened into  a gel or solid. Vision loss happens because the blockage in the vein causes fluid to leak out of the vein and collect in the retina. Sometimes  the cause is not known. What increases the risk? This condition is more likely to develop in:  People who have a blood vessel disease that causes narrowing of the arteries (atherosclerosis). This is the main risk factor for this condition.  People who are age 72 or older.  People who have any of the following medical conditions: ? High blood pressure (hypertension). ? Diabetes. ? Heart disease. ? High levels of fat in the blood (hyperlipidemia). ? Increased pressure inside the eye (glaucoma). ? Disorders that increase blood clotting (coagulopathy). What are the signs or symptoms? Symptoms of this condition include:  Sudden and painless vision loss. If the vision loss is partial, it may get worse over a span of hours or days.  Sudden and painless blurry vision.  Tiny spots or clumps that move across your vision (floaters).  Pain or pressure in your eye (uncommon). How is this diagnosed? This condition is usually diagnosed by a health care provider who specializes in eye diseases (ophthalmologist). To diagnose the condition, the ophthalmologist will do an eye exam. She or he may also:  Do an exam that involves having eye drops put in the eye (slit lamp exam or dilated fundus exam).  Order tests that help diagnose the condition, such as: ? A vision test. ? A measurement of the pressure inside your eye. ? A fluorescein angiogram. In this test, pictures are taken of your retina after a dye is injected into your bloodstream. ? Optical coherence tomography. In this test, light waves are used to create pictures of your retina.  Check your blood pressure.  Order tests to find the cause of your condition and rule out other conditions. The tests may check for these problems: ? Diabetes. ? High levels of fat or cholesterol in your blood. ? Abnormal blood clotting. How is this treated? There is no cure for this condition. The goals of treatment are to save as much of your vision as  possible and to prevent the condition from happening again. Treatment may include:  Medicine to reduce swelling in your eye. The medicine may be injected in your eye, or an implant may be placed in your eye to release the medicine.  Laser treatment to reduce swelling and stop fluid leakage in the eye (focal laser treatment).  Laser treatment to seal or destroy abnormal blood vessels (pan-retinal laser treatment). Follow these instructions at home:  Use over-the-counter and prescription medicines only as told by your health care provider.  Keep all follow-up visits as told by your health care provider. This is important. How is this prevented? To help prevent this condition:  Work with your health care providers to reduce your risk factors.  Do not use any products that contain nicotine or tobacco, such as cigarettes and e-cigarettes. If you need help quitting, ask your health care provider.  Follow a heart-healthy diet. This diet includes a lot of fruits, vegetables, grains, and lean protein. It is low in saturated fat and sugar.  Maintain a healthy weight.  Exercise regularly. Contact a health care provider if:  You have any changes in your vision. Get help right away if:  You have eye pain.  You have symptoms of this condition, including blurry vision or floaters.  You have new or sudden vision loss. Summary  Central retinal vein occlusion (CRVO) is  a blockage in the main blood vessel that carries blood away from the retina (central retinal vein). The retina is the part of your eye that senses light and sends signals to the brain that allow you to see.  CRVO can cause blurry vision. It can also cause complete or partial vision loss. The condition usually affects only one eye.  There is no cure for this condition. The goals of treatment are to save as much of your vision as possible and to prevent the condition from happening again.  Get help right away if you have new or  sudden vision loss. This information is not intended to replace advice given to you by your health care provider. Make sure you discuss any questions you have with your health care provider. Document Revised: 08/15/2017 Document Reviewed: 11/08/2016 Elsevier Patient Education  2020 [employer].      Explained the diagnoses, plan, and follow up with the patient and they expressed understanding.  Patient expressed understanding of the importance of proper follow up care.   Clent Demark Kaneshia Cater M.D. Diseases & Surgery of the Retina and Vitreous Retina & Diabetic [place] @TODAY @     Abbreviations: M myopia (nearsighted); A astigmatism; H hyperopia (farsighted); P presbyopia; Mrx spectacle prescription;  CTL contact lenses; OD right eye; OS left eye; OU both eyes  XT exotropia; ET esotropia; PEK punctate epithelial keratitis; PEE punctate epithelial erosions; DES dry eye syndrome; MGD meibomian gland dysfunction; ATs artificial tears; PFAT's preservative free artificial tears; [place] nuclear sclerotic cataract; PSC posterior subcapsular cataract; ERM epi-retinal membrane; PVD posterior vitreous detachment; RD retinal detachment; DM diabetes mellitus; DR diabetic retinopathy; NPDR non-proliferative diabetic retinopathy; PDR proliferative diabetic retinopathy; CSME clinically significant macular edema; DME diabetic macular edema; dbh dot blot hemorrhages; CWS cotton wool spot; POAG primary open angle glaucoma; C/D cup-to-disc ratio; HVF humphrey visual field; GVF goldmann visual field; OCT optical coherence tomography; IOP intraocular pressure; BRVO Branch retinal vein occlusion; CRVO central retinal vein occlusion; CRAO central retinal artery occlusion; BRAO branch retinal artery occlusion; RT retinal tear; SB scleral buckle; PPV pars plana vitrectomy; VH Vitreous hemorrhage; PRP panretinal laser photocoagulation; IVK intravitreal kenalog; VMT vitreomacular traction; MH Macular hole;  NVD  neovascularization of the disc; NVE neovascularization elsewhere; AREDS age related eye disease study; ARMD age related macular degeneration; POAG primary open angle glaucoma; EBMD epithelial/anterior basement membrane dystrophy; ACIOL anterior chamber intraocular lens; IOL intraocular lens; PCIOL posterior chamber intraocular lens; Phaco/IOL phacoemulsification with intraocular lens placement; [place] photorefractive keratectomy; LASIK laser assisted in situ keratomileusis; HTN hypertension; DM diabetes mellitus; COPD chronic obstructive pulmonary disease

## 2020-01-03 NOTE — Patient Instructions (Addendum)
The nature of central retinal vein occlusion was discussed with the patient including the division of types into nonischemic ischemic. The potential sequelae of ischemic central retinal vein occlusion, including macular edema, neovascularization, rubeosis iridis, and neovascular glaucoma, were discussed, and the need for frequent follow-up.  The nature of macular edema and central retinal vein occlusion was discussed. The following options were considered:  1.Observation for a period to look for spontaneous improvement, is no linger the primary therapy. One-third worsen, one-third stay unchanged, and one-third improves.  2. Anti-VEGF Therapy. ( Lucentis, Avastin or Eylea ) injected  in intravitreal fashion, initially monthly then tailored to clinical response.  3. Intravitreal steroid usage, Kenalog, or Ozurdex, usually a second line therapy or in combination with anti-Vegf therapy noted above.  4. Panretinal laser photocoagulation to cause regression of iris neovascularization, or treat retinal  non-perfusion.  5. Surgical Management may include vitrectomy with incisions of peripheral veins to trigger retino choroidal anastomosis formation. This topic presented and discussed at [place].  OS currently with incomplete central retinal vein occlusion and significant distal peripheral dot and blot retinal hemorrhages.  Early collateralization on the optic nerve is noted thus suggesting some intermittent chronicity.  For this reason in the absence of cystoid macular edema, we will simply treat topically in an attempt to lower the intraocular pressure to allow for improved central retinal artery transit time and hopefully lessening of the is a incomplete central retinal vein occlusion.    Central Retinal Vein Occlusion, Adult  Central retinal vein occlusion (CRVO) is a blockage in the main blood vessel that carries blood away from the retina (central retinal vein). The retina is the part of your  eye that senses light and sends signals to the brain that allow you to see. CRVO can cause blurry vision. It can also cause complete or partial vision loss. The condition usually affects only one eye. What are the causes? This condition is usually caused by a blood clot that forms inside the central retinal vein. A clot is blood that has thickened into a gel or solid. Vision loss happens because the blockage in the vein causes fluid to leak out of the vein and collect in the retina. Sometimes the cause is not known. What increases the risk? This condition is more likely to develop in:  People who have a blood vessel disease that causes narrowing of the arteries (atherosclerosis). This is the main risk factor for this condition.  People who are age 62 or older.  People who have any of the following medical conditions: ? High blood pressure (hypertension). ? Diabetes. ? Heart disease. ? High levels of fat in the blood (hyperlipidemia). ? Increased pressure inside the eye (glaucoma). ? Disorders that increase blood clotting (coagulopathy). What are the signs or symptoms? Symptoms of this condition include:  Sudden and painless vision loss. If the vision loss is partial, it may get worse over a span of hours or days.  Sudden and painless blurry vision.  Tiny spots or clumps that move across your vision (floaters).  Pain or pressure in your eye (uncommon). How is this diagnosed? This condition is usually diagnosed by a health care provider who specializes in eye diseases (ophthalmologist). To diagnose the condition, the ophthalmologist will do an eye exam. She or he may also:  Do an exam that involves having eye drops put in the eye (slit lamp exam or dilated fundus exam).  Order tests that help diagnose the condition, such as: ? A  vision test. ? A measurement of the pressure inside your eye. ? A fluorescein angiogram. In this test, pictures are taken of your retina after a dye is  injected into your bloodstream. ? Optical coherence tomography. In this test, light waves are used to create pictures of your retina.  Check your blood pressure.  Order tests to find the cause of your condition and rule out other conditions. The tests may check for these problems: ? Diabetes. ? High levels of fat or cholesterol in your blood. ? Abnormal blood clotting. How is this treated? There is no cure for this condition. The goals of treatment are to save as much of your vision as possible and to prevent the condition from happening again. Treatment may include:  Medicine to reduce swelling in your eye. The medicine may be injected in your eye, or an implant may be placed in your eye to release the medicine.  Laser treatment to reduce swelling and stop fluid leakage in the eye (focal laser treatment).  Laser treatment to seal or destroy abnormal blood vessels (pan-retinal laser treatment). Follow these instructions at home:  Use over-the-counter and prescription medicines only as told by your health care provider.  Keep all follow-up visits as told by your health care provider. This is important. How is this prevented? To help prevent this condition:  Work with your health care providers to reduce your risk factors.  Do not use any products that contain nicotine or tobacco, such as cigarettes and e-cigarettes. If you need help quitting, ask your health care provider.  Follow a heart-healthy diet. This diet includes a lot of fruits, vegetables, grains, and lean protein. It is low in saturated fat and sugar.  Maintain a healthy weight.  Exercise regularly. Contact a health care provider if:  You have any changes in your vision. Get help right away if:  You have eye pain.  You have symptoms of this condition, including blurry vision or floaters.  You have new or sudden vision loss. Summary  Central retinal vein occlusion (CRVO) is a blockage in the main blood vessel  that carries blood away from the retina (central retinal vein). The retina is the part of your eye that senses light and sends signals to the brain that allow you to see.  CRVO can cause blurry vision. It can also cause complete or partial vision loss. The condition usually affects only one eye.  There is no cure for this condition. The goals of treatment are to save as much of your vision as possible and to prevent the condition from happening again.  Get help right away if you have new or sudden vision loss. This information is not intended to replace advice given to you by your health care provider. Make sure you discuss any questions you have with your health care provider. Document Revised: 08/15/2017 Document Reviewed: 11/08/2016 Elsevier Patient Education  2020 [employer].

## 2020-02-15 ENCOUNTER — Encounter (INDEPENDENT_AMBULATORY_CARE_PROVIDER_SITE_OTHER): Payer: Managed Care, Other (non HMO) | Admitting: Ophthalmology

## 2020-02-22 ENCOUNTER — Other Ambulatory Visit: Payer: Self-pay

## 2020-02-22 ENCOUNTER — Encounter (INDEPENDENT_AMBULATORY_CARE_PROVIDER_SITE_OTHER): Payer: Self-pay | Admitting: Ophthalmology

## 2020-02-22 ENCOUNTER — Ambulatory Visit (INDEPENDENT_AMBULATORY_CARE_PROVIDER_SITE_OTHER): Payer: Managed Care, Other (non HMO) | Admitting: Ophthalmology

## 2020-02-22 DIAGNOSIS — H348122 Central retinal vein occlusion, left eye, stable: Secondary | ICD-10-CM

## 2020-02-22 NOTE — Assessment & Plan Note (Signed)
Incomplete central retinal vein occlusion of the left eye, stable over the last 6 weeks.  Intraocular pressure has improved on topical low-dose Timoptic therapy OS.  The optic nerve appears to have early signs of collateralization.  We will continue to monitor this.  Most importantly the macula is free of macular edema and retinal hemorrhage.  Patient is to return promptly if new visual symptoms were to develop including visual acuity decline with reading.

## 2020-02-22 NOTE — Progress Notes (Signed)
02/22/2020     CHIEF COMPLAINT Patient presents for Retina Follow Up   HISTORY OF PRESENT ILLNESS: Wesley Simmons is a 59 y.o. male who presents to the clinic today for:   HPI    Retina Follow Up    Patient presents with  CRVO/BRVO.  In left eye.  Severity is moderate.  Duration of 7 weeks.  Since onset it is stable.  I, the attending physician,  performed the HPI with the patient and updated documentation appropriately.          Comments    7 Week CRVO f\u. FP  Pt sates he still sees FOL out of OS when he turns his head to the side. Pt states he can tell OS is slightly foggy while using readers. Still seeing floaters. Pt using gtts as directed.       Last edited by [name] on 02/22/2020  8:18 AM. (History)      Referring physician: Burnard Bunting, MD [place],  [place] [address]  HISTORICAL INFORMATION:   Selected notes from the MEDICAL RECORD NUMBER       CURRENT MEDICATIONS: Current Outpatient Medications (Ophthalmic Drugs)  Medication Sig  . timolol (TIMOPTIC) 0.25 % ophthalmic solution Place 1 drop into the left eye daily.   No current facility-administered medications for this visit. (Ophthalmic Drugs)   Current Outpatient Medications (Other)  Medication Sig  . aspirin 81 MG tablet Take 81 mg by mouth as needed.  . influenza vac split quadrivalent PF (AFLURIA QUADRIVALENT) 0.5 ML injection ADM 0.5ML IM UTD  . Multiple Vitamins-Minerals (CENTRUM SILVER PO) Take 1 tablet by mouth as needed.  . vitamin B-12 (CYANOCOBALAMIN) 1000 MCG tablet Take 1,000 mcg by mouth as needed.   No current facility-administered medications for this visit. (Other)      REVIEW OF SYSTEMS:    ALLERGIES No Known Allergies  PAST MEDICAL HISTORY Past Medical History:  Diagnosis Date  . Allergy    mild congestion  . Arthritis   . 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 History of colon polyps   . Upper respiratory infection 08/2017   Past  Surgical History:  Procedure Laterality Date  . ACHILLES TENDON REPAIR  2006   right  . COLONOSCOPY    . POLYPECTOMY      FAMILY HISTORY Family History  Problem Relation Age of Onset  . Breast cancer Mother   . Colon polyps Father   . Colon cancer Paternal Grandfather   . Esophageal cancer Neg Hx   . Rectal cancer Neg Hx   . Stomach cancer Neg Hx     SOCIAL HISTORY Social History   Tobacco Use  . Smoking status: Never Smoker  . Smokeless tobacco: Never Used  Substance Use Topics  . Alcohol use: Yes    Alcohol/week: 3.0 standard drinks    Types: 2 Glasses of wine, 1 Cans of beer per week  . Drug use: No         OPHTHALMIC EXAM: Base Eye Exam    Visual Acuity (Snellen - Linear)      Right Left   Dist [place] 20/20 20/20       Tonometry (Tonopen, 8:17 AM)      Right Left   Pressure 12 13       Pupils      Dark Light Shape React APD   Right 5 3 Round Brisk None   Left 5 3  Round Brisk None       Visual Fields (Counting fingers)      Left Right    Full Full       Neuro/Psych    Oriented x3: Yes   Mood/Affect: Normal        Slit Lamp and Fundus Exam    External Exam      Right Left   External Normal Normal       Slit Lamp Exam      Right Left   Lids/Lashes Normal Normal   Conjunctiva/Sclera White and quiet White and quiet   Cornea Clear Clear   Anterior Chamber Deep and quiet Deep and quiet   Iris Round and reactive Round and reactive   Lens 1+ Nuclear sclerosis 1+ Nuclear sclerosis   Anterior Vitreous Normal Normal       Fundus Exam      Right Left   Posterior Vitreous Normal Posterior vitreous detachment   Disc Normal Collaterals, these are not neovascularization   C/D Ratio 0.5 0.45   Macula Normal Normal, no cystoid macular edema   Vessels Normal Tortuous,, and slightly engorged at the optic nerve.  , Peripheral dot blot hemorrhages are noted temporally and inferiorly largely along the inferior hemiretinal vein   Periphery Normal Normal,  no holes or tears          IMAGING AND PROCEDURES  Imaging and Procedures for 02/22/20           ASSESSMENT/PLAN:  No problem-specific Assessment & Plan notes found for this encounter.      ICD-10-CM   1. Central retinal vein occlusion of left eye, unspecified complication status  T46.5681 Color Fundus Photography Optos - OU - Both Eyes    1.  2.  3.  Ophthalmic Meds Ordered this visit:  No orders of the defined types were placed in this encounter.      No follow-ups on file.  There are no Patient Instructions on file for this visit.   Explained the diagnoses, plan, and follow up with the patient and they expressed understanding.  Patient expressed understanding of the importance of proper follow up care.   [name] M.D. Diseases & Surgery of the Retina and Vitreous Retina & Diabetic [place] 02/22/20     Abbreviations: M myopia (nearsighted); A astigmatism; H hyperopia (farsighted); P presbyopia; Mrx spectacle prescription;  CTL contact lenses; OD right eye; OS left eye; OU both eyes  XT exotropia; ET esotropia; PEK punctate epithelial keratitis; PEE punctate epithelial erosions; DES dry eye syndrome; MGD meibomian gland dysfunction; ATs artificial tears; PFAT's preservative free artificial tears; [place] nuclear sclerotic cataract; PSC posterior subcapsular cataract; ERM epi-retinal membrane; PVD posterior vitreous detachment; RD retinal detachment; DM diabetes mellitus; DR diabetic retinopathy; NPDR non-proliferative diabetic retinopathy; PDR proliferative diabetic retinopathy; CSME clinically significant macular edema; DME diabetic macular edema; dbh dot blot hemorrhages; CWS cotton wool spot; POAG primary open angle glaucoma; C/D cup-to-disc ratio; HVF humphrey visual field; GVF goldmann visual field; OCT optical coherence tomography; IOP intraocular pressure; BRVO Branch retinal vein occlusion; CRVO central retinal vein occlusion; CRAO central retinal  artery occlusion; BRAO branch retinal artery occlusion; RT retinal tear; SB scleral buckle; PPV pars plana vitrectomy; VH Vitreous hemorrhage; PRP panretinal laser photocoagulation; IVK intravitreal kenalog; VMT vitreomacular traction; MH Macular hole;  NVD neovascularization of the disc; NVE neovascularization elsewhere; AREDS age related eye disease study; ARMD age related macular degeneration; POAG primary open angle glaucoma; EBMD epithelial/anterior basement membrane dystrophy; ACIOL  anterior chamber intraocular lens; IOL intraocular lens; PCIOL posterior chamber intraocular lens; Phaco/IOL phacoemulsification with intraocular lens placement; [place] photorefractive keratectomy; LASIK laser assisted in situ keratomileusis; HTN hypertension; DM diabetes mellitus; COPD chronic obstructive pulmonary disease

## 2020-04-03 ENCOUNTER — Encounter (INDEPENDENT_AMBULATORY_CARE_PROVIDER_SITE_OTHER): Payer: Self-pay | Admitting: Ophthalmology

## 2020-04-03 ENCOUNTER — Other Ambulatory Visit: Payer: Self-pay

## 2020-04-03 ENCOUNTER — Ambulatory Visit (INDEPENDENT_AMBULATORY_CARE_PROVIDER_SITE_OTHER): Payer: Managed Care, Other (non HMO) | Admitting: Ophthalmology

## 2020-04-03 DIAGNOSIS — H348122 Central retinal vein occlusion, left eye, stable: Secondary | ICD-10-CM

## 2020-04-03 MED ORDER — TIMOLOL MALEATE 0.25 % OP SOLN: 1.0000 [drp] | Freq: Every day | OPHTHALMIC | 5 refills | Status: AC

## 2020-04-03 NOTE — Progress Notes (Signed)
04/03/2020     CHIEF COMPLAINT Patient presents for Retina Follow Up   HISTORY OF PRESENT ILLNESS: Wesley Simmons is a 59 y.o. male who presents to the clinic today for:   HPI    Retina Follow Up    Patient presents with  CRVO/BRVO.  In both eyes.  This started 6 weeks ago.  Duration of 6 weeks.  Since onset it is stable.          Comments    6 week f/u dilated exam, OCT(MAC) and Fundus photos today.  Pt denies noticeable changes to [place] OU since last visit. Pt denies ocular pain, flashes of light, or floaters OU.         Last edited by Melburn Popper, COA on 04/03/2020  8:58 AM. (History)      Referring physician: Burnard Bunting, MD [place],  [place] [id number]  HISTORICAL INFORMATION:   Selected notes from the MEDICAL RECORD NUMBER       CURRENT MEDICATIONS: Current Outpatient Medications (Ophthalmic Drugs)  Medication Sig  . timolol (TIMOPTIC) 0.25 % ophthalmic solution Place 1 drop into the left eye daily.   No current facility-administered medications for this visit. (Ophthalmic Drugs)   Current Outpatient Medications (Other)  Medication Sig  . aspirin 81 MG tablet Take 81 mg by mouth as needed.  . influenza vac split quadrivalent PF (AFLURIA QUADRIVALENT) 0.5 ML injection ADM 0.5ML IM UTD  . Multiple Vitamins-Minerals (CENTRUM SILVER PO) Take 1 tablet by mouth as needed.  . vitamin B-12 (CYANOCOBALAMIN) 1000 MCG tablet Take 1,000 mcg by mouth as needed.   No current facility-administered medications for this visit. (Other)      REVIEW OF SYSTEMS:    ALLERGIES No Known Allergies  PAST MEDICAL HISTORY Past Medical History:  Diagnosis Date  . Allergy    mild congestion  . Arthritis   . 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 History of colon polyps   . Upper respiratory infection 08/2017   Past Surgical History:  Procedure Laterality Date  . ACHILLES TENDON REPAIR  2006   right  . COLONOSCOPY    . POLYPECTOMY        FAMILY HISTORY Family History  Problem Relation Age of Onset  . Breast cancer Mother   . Colon polyps Father   . Colon cancer Paternal Grandfather   . Esophageal cancer Neg Hx   . Rectal cancer Neg Hx   . Stomach cancer Neg Hx     SOCIAL HISTORY Social History   Tobacco Use  . Smoking status: Never Smoker  . Smokeless tobacco: Never Used  Vaping Use  . Vaping Use: Never used  Substance Use Topics  . Alcohol use: Yes    Alcohol/week: 3.0 standard drinks    Types: 2 Glasses of wine, 1 Cans of beer per week  . Drug use: No         OPHTHALMIC EXAM:  Base Eye Exam    Visual Acuity (ETDRS)      Right Left   Dist [place] 20/20 20/20       Tonometry (Tonopen, 9:01 AM)      Right Left   Pressure 14 12       Pupils      Pupils Dark Light Shape React APD   Right PERRL 5 3 Round Brisk None   Left PERRL 5 3 Round Brisk None       Visual Fields (  Counting fingers)      Left Right    Full Full       Extraocular Movement      Right Left    Full Full       Neuro/Psych    Oriented x3: Yes   Mood/Affect: Normal       Dilation    Both eyes: 1.0% Mydriacyl, 2.5% Phenylephrine @ 9:01 AM        Slit Lamp and Fundus Exam    External Exam      Right Left   External Normal Normal       Slit Lamp Exam      Right Left   Lids/Lashes Normal Normal   Conjunctiva/Sclera White and quiet White and quiet   Cornea Clear Clear   Anterior Chamber Deep and quiet Deep and quiet   Iris Round and reactive Round and reactive   Lens Trace Nuclear sclerosis Trace Nuclear sclerosis   Anterior Vitreous Normal Normal       Fundus Exam      Right Left   Posterior Vitreous Normal Posterior vitreous detachment   Disc Normal Collaterals, these are not neovascularization   C/D Ratio 0.55 0.5   Macula Normal Normal, no cystoid macular edema   Vessels Normal Tortuous,, and slightly engorged at the optic nerve.  , Peripheral dot blot hemorrhages are noted temporally and inferiorly  largely along the inferior hemiretinal vein   Periphery Normal Normal, no holes or tears          IMAGING AND PROCEDURES  Imaging and Procedures for 04/03/20  OCT, Retina - OU - Both Eyes       Right Eye Quality was good. Scan locations included subfoveal. Central Foveal Thickness: 279. Progression has been stable.   Left Eye Quality was good. Scan locations included subfoveal. Central Foveal Thickness: 275. Progression has been stable. Findings include normal foveal contour.   Notes No signs of CME OU       Color Fundus Photography Optos - OU - Both Eyes       Right Eye Progression has been stable. Macula : normal observations. Vessels : normal observations. Periphery : normal observations.   Left Eye Progression has improved. Macula : normal observations.   Notes OS, incomplete central retinal vein occlusion, nonischemic, no evidence of CME, no significant microaneurysms in the macula.  The optic nerve however does show signs of collateralization as well as the persistent engorged vein.  OD is normal                ASSESSMENT/PLAN:  No problem-specific Assessment & Plan notes found for this encounter.      ICD-10-CM   1. Central retinal vein occlusion of left eye, unspecified complication status  D63.8756 OCT, Retina - OU - Both Eyes    Color Fundus Photography Optos - OU - Both Eyes    1.  Patient is instructed to continue on Timoptic 0.25% 1 drop left eye daily to lower the intraocular pressure to perhaps enhance microperfusion of the peripheral capillary,  end arterial vascular system  2.  Patient is also instructed to use vitamin B complex in case he has a folate deficiency or her is heterozygous for homocystinemia.  3.  Ophthalmic Meds Ordered this visit:  No orders of the defined types were placed in this encounter.      No follow-ups on file.  There are no Patient Instructions on file for this visit.   Explained the diagnoses,  plan, and follow up with the patient and they expressed understanding.  Patient expressed understanding of the importance of proper follow up care.   Clent Demark Mishelle Hassan M.D. Diseases & Surgery of the Retina and Vitreous Retina & Diabetic [place] 04/03/20     Abbreviations: M myopia (nearsighted); A astigmatism; H hyperopia (farsighted); P presbyopia; Mrx spectacle prescription;  CTL contact lenses; OD right eye; OS left eye; OU both eyes  XT exotropia; ET esotropia; PEK punctate epithelial keratitis; PEE punctate epithelial erosions; DES dry eye syndrome; MGD meibomian gland dysfunction; ATs artificial tears; PFAT's preservative free artificial tears; [place] nuclear sclerotic cataract; PSC posterior subcapsular cataract; ERM epi-retinal membrane; PVD posterior vitreous detachment; RD retinal detachment; DM diabetes mellitus; DR diabetic retinopathy; NPDR non-proliferative diabetic retinopathy; PDR proliferative diabetic retinopathy; CSME clinically significant macular edema; DME diabetic macular edema; dbh dot blot hemorrhages; CWS cotton wool spot; POAG primary open angle glaucoma; C/D cup-to-disc ratio; HVF humphrey visual field; GVF goldmann visual field; OCT optical coherence tomography; IOP intraocular pressure; BRVO Branch retinal vein occlusion; CRVO central retinal vein occlusion; CRAO central retinal artery occlusion; BRAO branch retinal artery occlusion; RT retinal tear; SB scleral buckle; PPV pars plana vitrectomy; VH Vitreous hemorrhage; PRP panretinal laser photocoagulation; IVK intravitreal kenalog; VMT vitreomacular traction; MH Macular hole;  NVD neovascularization of the disc; NVE neovascularization elsewhere; AREDS age related eye disease study; ARMD age related macular degeneration; POAG primary open angle glaucoma; EBMD epithelial/anterior basement membrane dystrophy; ACIOL anterior chamber intraocular lens; IOL intraocular lens; PCIOL posterior chamber intraocular lens; Phaco/IOL  phacoemulsification with intraocular lens placement; [place] photorefractive keratectomy; LASIK laser assisted in situ keratomileusis; HTN hypertension; DM diabetes mellitus; COPD chronic obstructive pulmonary disease

## 2020-04-03 NOTE — Patient Instructions (Signed)
Patient instructed in monocular testing of the vision at home at least once weekly and at the visual acuity left eye becomes blurry smudged or different then currently he should report that immediately.

## 2020-04-03 NOTE — Assessment & Plan Note (Signed)
Visual acuity and macular condition are still stable.  There is no accumulated CME.  Engorged retinal vein and peripheral dot blot hemorrhages in the left eye suggest this is an ongoing potentially dissolving central retinal vein occlusion, incomplete.  Collateralization of vessels at the nerve do suggest the need for ongoing observation.

## 2020-07-04 ENCOUNTER — Other Ambulatory Visit: Payer: Self-pay

## 2020-07-04 ENCOUNTER — Encounter (INDEPENDENT_AMBULATORY_CARE_PROVIDER_SITE_OTHER): Payer: Self-pay | Admitting: Ophthalmology

## 2020-07-04 ENCOUNTER — Ambulatory Visit (INDEPENDENT_AMBULATORY_CARE_PROVIDER_SITE_OTHER): Payer: Managed Care, Other (non HMO) | Admitting: Ophthalmology

## 2020-07-04 DIAGNOSIS — H348122 Central retinal vein occlusion, left eye, stable: Secondary | ICD-10-CM

## 2020-07-04 NOTE — Assessment & Plan Note (Signed)
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  Moreover, peripherally there are much fewer intraretinal hemorrhage.  Particularly in the temporal periphery.  In the absence of macular edema but in the presence of a collateralizing optic nerve we will continue to observe.  There is no macular edema.We will asked patient to follow-up in 2 to 3 months.  He continues on vitamin B complex as well as on aspirin 81 mg twice weekly.

## 2020-07-04 NOTE — Progress Notes (Signed)
07/04/2020     CHIEF COMPLAINT Patient presents for Retina Follow Up   HISTORY OF PRESENT ILLNESS: Wesley Simmons is a 59 y.o. male who presents to the clinic today for:   HPI    Retina Follow Up    Patient presents with  CRVO/BRVO.  In left eye.  This started 3 months ago.  Severity is mild.  Duration of 3 months.  Since onset it is stable.          Comments    3 Month F/U OU  Pt c/o "fogginess" OS, and sts VA is not as clear as OD. No new symptoms reported.       Last edited by Rockie Neighbours, [place] on 07/04/2020  8:16 AM. (History)      Referring physician: Burnard Bunting, MD [place],  [place] [address]  HISTORICAL INFORMATION:   Selected notes from the MEDICAL RECORD NUMBER       CURRENT MEDICATIONS: Current Outpatient Medications (Ophthalmic Drugs)  Medication Sig  . timolol (TIMOPTIC) 0.25 % ophthalmic solution Place 1 drop into the left eye daily.   No current facility-administered medications for this visit. (Ophthalmic Drugs)   Current Outpatient Medications (Other)  Medication Sig  . aspirin 81 MG tablet Take 81 mg by mouth as needed.  . influenza vac split quadrivalent PF (AFLURIA QUADRIVALENT) 0.5 ML injection ADM 0.5ML IM UTD  . Multiple Vitamins-Minerals (CENTRUM SILVER PO) Take 1 tablet by mouth as needed.  . vitamin B-12 (CYANOCOBALAMIN) 1000 MCG tablet Take 1,000 mcg by mouth as needed.   No current facility-administered medications for this visit. (Other)      REVIEW OF SYSTEMS:    ALLERGIES No Known Allergies  PAST MEDICAL HISTORY Past Medical History:  Diagnosis Date  . Allergy    mild congestion  . Arthritis   . Cancer ([place])    basal cell on r. temple  . History of colon polyps   . Upper respiratory infection 08/2017   Past Surgical History:  Procedure Laterality Date  . ACHILLES TENDON REPAIR  2006   right  . COLONOSCOPY    . POLYPECTOMY      FAMILY HISTORY Family History  Problem Relation Age  of Onset  . Breast cancer Mother   . Colon polyps Father   . Colon cancer Paternal Grandfather   . Esophageal cancer Neg Hx   . Rectal cancer Neg Hx   . Stomach cancer Neg Hx     SOCIAL HISTORY Social History   Tobacco Use  . Smoking status: Never Smoker  . Smokeless tobacco: Never Used  Vaping Use  . Vaping Use: Never used  Substance Use Topics  . Alcohol use: Yes    Alcohol/week: 3.0 standard drinks    Types: 2 Glasses of wine, 1 Cans of beer per week  . Drug use: No         OPHTHALMIC EXAM: Base Eye Exam    Visual Acuity (ETDRS)      Right Left   Dist [place] 20/20 20/20 -2       Tonometry (Tonopen, 8:17 AM)      Right Left   Pressure 15 13       Pupils      Pupils Dark Light Shape React APD   Right PERRL 5 4 Irregular Brisk None   Left PERRL 5 4 Round Brisk None       Visual Fields (Counting fingers)      Left Right  Full Full       Extraocular Movement      Right Left    Full Full       Neuro/Psych    Oriented x3: Yes   Mood/Affect: Normal       Dilation    Both eyes: 1.0% Mydriacyl, 2.5% Phenylephrine @ 8:20 AM        Slit Lamp and Fundus Exam    External Exam      Right Left   External Normal Normal       Slit Lamp Exam      Right Left   Lids/Lashes Normal Normal   Conjunctiva/Sclera White and quiet White and quiet   Cornea Clear Clear   Anterior Chamber Deep and quiet Deep and quiet   Iris Round and reactive Round and reactive   Lens Trace Nuclear sclerosis Trace Nuclear sclerosis   Anterior Vitreous Normal Normal       Fundus Exam      Right Left   Posterior Vitreous Normal Posterior vitreous detachment   Disc Normal Collaterals, these are not neovascularization   C/D Ratio 0.55 0.5   Macula Normal Normal, no cystoid macular edema   Vessels Normal Tortuous,, and slightly engorged at the optic nerve.  , Peripheral dot blot hemorrhages are noted temporally and inferiorly largely along the inferior hemiretinal vein    Periphery Normal Normal, no holes or tears          IMAGING AND PROCEDURES  Imaging and Procedures for 07/04/20  Color Fundus Photography Optos - OU - Both Eyes       Right Eye Progression has been stable. Disc findings include normal observations. Macula : normal observations. Vessels : normal observations. Periphery : normal observations.   Left Eye Progression has improved. Macula : normal observations. Vessels : tortuous vessels. Periphery : normal observations.   Notes OD with normal circulation  OS with early collateralization on the optic nerve, no neovascularization of the disc.  Peripherally much fewer scattered retinal hemorrhages.  No signs of macular edema nor macular aneurysmal changes in the macula.                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  Moreover, peripherally there are much fewer intraretinal hemorrhage.  Particularly in the temporal periphery.  In the absence of macular edema but in the presence of a collateralizing optic nerve we will continue to observe.  There is no macular edema.We will asked patient to follow-up in 2 to 3 months.  He continues on vitamin B complex as well as on aspirin 81 mg twice weekly.      ICD-10-CM   1. Central retinal vein occlusion of left eye, unspecified complication status  H63.1497 Color Fundus Photography Optos - OU - Both Eyes    1.  Observation will be undertaken to monitor for ongoing collateralization, at the optic nerve, from incomplete central retinal vein occlusion left eye  2.  Patient instructed to contact the office promptly should new difficulties arise, with clouding or worsening of vision in the left eye  3.  Ophthalmic Meds Ordered this visit:  No orders of the defined types were placed in this encounter.      Return in about 3  months (around 10/04/2020) for COLOR FP, dilate, OS.  Patient Instructions  I also discussed with the patient to  consider nightly oximetry measuring, via home ring monitoring with a wellnue, only available commercially.  This will allow sure that he is not having nightly hypoxic episodes which could trigger periodic hypertension unrecognized, often seen in sleep apnea patients.    Explained the diagnoses, plan, and follow up with the patient and they expressed understanding.  Patient expressed understanding of the importance of proper follow up care.   Clent Demark Faaris Arizpe M.D. Diseases & Surgery of the Retina and Vitreous Retina & Diabetic [place] 07/04/20     Abbreviations: M myopia (nearsighted); A astigmatism; H hyperopia (farsighted); P presbyopia; Mrx spectacle prescription;  CTL contact lenses; OD right eye; OS left eye; OU both eyes  XT exotropia; ET esotropia; PEK punctate epithelial keratitis; PEE punctate epithelial erosions; DES dry eye syndrome; MGD meibomian gland dysfunction; ATs artificial tears; PFAT's preservative free artificial tears; [place] nuclear sclerotic cataract; PSC posterior subcapsular cataract; ERM epi-retinal membrane; PVD posterior vitreous detachment; RD retinal detachment; DM diabetes mellitus; DR diabetic retinopathy; NPDR non-proliferative diabetic retinopathy; PDR proliferative diabetic retinopathy; CSME clinically significant macular edema; DME diabetic macular edema; dbh dot blot hemorrhages; CWS cotton wool spot; POAG primary open angle glaucoma; C/D cup-to-disc ratio; HVF humphrey visual field; GVF goldmann visual field; OCT optical coherence tomography; IOP intraocular pressure; BRVO Branch retinal vein occlusion; CRVO central retinal vein occlusion; CRAO central retinal artery occlusion; BRAO branch retinal artery occlusion; RT retinal tear; SB scleral buckle; PPV pars plana vitrectomy; VH Vitreous hemorrhage; PRP panretinal laser photocoagulation; IVK  intravitreal kenalog; VMT vitreomacular traction; MH Macular hole;  NVD neovascularization of the disc; NVE neovascularization elsewhere; AREDS age related eye disease study; ARMD age related macular degeneration; POAG primary open angle glaucoma; EBMD epithelial/anterior basement membrane dystrophy; ACIOL anterior chamber intraocular lens; IOL intraocular lens; PCIOL posterior chamber intraocular lens; Phaco/IOL phacoemulsification with intraocular lens placement; [place] photorefractive keratectomy; LASIK laser assisted in situ keratomileusis; HTN hypertension; DM diabetes mellitus; COPD chronic obstructive pulmonary disease

## 2020-07-04 NOTE — Patient Instructions (Signed)
I also discussed with the patient to consider nightly oximetry measuring, via home ring monitoring with a wellnue, only available commercially.  This will allow sure that he is not having nightly hypoxic episodes which could trigger periodic hypertension unrecognized, often seen in sleep apnea patients.

## 2020-10-05 ENCOUNTER — Encounter (INDEPENDENT_AMBULATORY_CARE_PROVIDER_SITE_OTHER): Payer: Self-pay | Admitting: Ophthalmology

## 2020-10-05 ENCOUNTER — Other Ambulatory Visit: Payer: Self-pay

## 2020-10-05 ENCOUNTER — Ambulatory Visit (INDEPENDENT_AMBULATORY_CARE_PROVIDER_SITE_OTHER): Payer: Managed Care, Other (non HMO) | Admitting: Ophthalmology

## 2020-10-05 DIAGNOSIS — H2512 Age-related nuclear cataract, left eye: Secondary | ICD-10-CM | POA: Diagnosis not present

## 2020-10-05 DIAGNOSIS — H35712 Central serous chorioretinopathy, left eye: Secondary | ICD-10-CM

## 2020-10-05 DIAGNOSIS — H35372 Puckering of macula, left eye: Secondary | ICD-10-CM

## 2020-10-05 DIAGNOSIS — H348122 Central retinal vein occlusion, left eye, stable: Secondary | ICD-10-CM

## 2020-10-05 NOTE — Assessment & Plan Note (Signed)
The nature of macular pucker (epiretinal membrane ERM) was discussed with the patient as well as threshold criteria for vitrectomy surgery. I explained that in rare cases another surgery is needed to actually remove a second wrinkle should it regrow.  Most often, the epiretinal membrane and underlying wrinkled internal limiting membrane are removed with the first surgery, to accomplish the goals.   If the operative eye is Phakic (natural lens still present), cataract surgery is often recommended prior to Vitrectomy. This will enable the retina surgeon to have the best view during surgery and the patient to obtain optimal results in the future. Treatment options were discussed.  This is a new finding with secondary retinal thickening but no associated CME.  Thus we will continue to monitor and this may explain the patient's new onset "notable change in quality" of vision left eye.

## 2020-10-05 NOTE — Assessment & Plan Note (Signed)

## 2020-10-05 NOTE — Assessment & Plan Note (Signed)
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

## 2020-10-05 NOTE — Assessment & Plan Note (Signed)
Compensated central retinal vein occlusion, with collateralization of the optic nerve, no peripheral dot blot hemorrhages of significant importance.  There is no signs of retinal vascular ischemia.  More importantly there is no CME nor NVE thus do not need to render therapy for this condition at this time.

## 2020-10-05 NOTE — Progress Notes (Addendum)
10/05/2020     CHIEF COMPLAINT Patient presents for Retina Follow Up (3 MO FU OS///Pt reports vision still not as clear in OS, pt denies any new F/F OU, denies pain or pressure OU. )   HISTORY OF PRESENT ILLNESS: Wesley Simmons is a 60 y.o. male who presents to the clinic today for:   HPI    Retina Follow Up    Patient presents with  CRVO/BRVO.  In left eye.  This started 3 months ago.  Duration of 3 months. Additional comments: 3 MO FU OS   Pt reports vision still not as clear in OS, pt denies any new F/F OU, denies pain or pressure OU.        Last edited by Nichola Sizer D on 10/05/2020  8:30 AM. (History)      Referring physician: Burnard Bunting, MD [place],  [place] [id number]  HISTORICAL INFORMATION:   Selected notes from the MEDICAL RECORD NUMBER       CURRENT MEDICATIONS: Current Outpatient Medications (Ophthalmic Drugs)  Medication Sig  . timolol (TIMOPTIC) 0.25 % ophthalmic solution Place 1 drop into the left eye daily.   No current facility-administered medications for this visit. (Ophthalmic Drugs)   Current Outpatient Medications (Other)  Medication Sig  . aspirin 81 MG tablet Take 81 mg by mouth as needed.  . influenza vac split quadrivalent PF (AFLURIA QUADRIVALENT) 0.5 ML injection ADM 0.5ML IM UTD  . Multiple Vitamins-Minerals (CENTRUM SILVER PO) Take 1 tablet by mouth as needed.  . vitamin B-12 (CYANOCOBALAMIN) 1000 MCG tablet Take 1,000 mcg by mouth as needed.   No current facility-administered medications for this visit. (Other)      REVIEW OF SYSTEMS:    ALLERGIES No Known Allergies  PAST MEDICAL HISTORY Past Medical History:  Diagnosis Date  . Allergy    mild congestion  . Arthritis   . Cancer ([place])    basal cell on r. temple  . History of colon polyps   . Upper respiratory infection 08/2017   Past Surgical History:  Procedure Laterality Date  . ACHILLES TENDON REPAIR  2006   right  . COLONOSCOPY     . POLYPECTOMY      FAMILY HISTORY Family History  Problem Relation Age of Onset  . Breast cancer Mother   . Colon polyps Father   . Colon cancer Paternal Grandfather   . Esophageal cancer Neg Hx   . Rectal cancer Neg Hx   . Stomach cancer Neg Hx     SOCIAL HISTORY Social History   Tobacco Use  . Smoking status: Never Smoker  . Smokeless tobacco: Never Used  Vaping Use  . Vaping Use: Never used  Substance Use Topics  . Alcohol use: Yes    Alcohol/week: 3.0 standard drinks    Types: 2 Glasses of wine, 1 Cans of beer per week  . Drug use: No         OPHTHALMIC EXAM:  Base Eye Exam    Visual Acuity (ETDRS)      Right Left   Dist [place] 20/20 20/20       Tonometry (Tonopen, 8:34 AM)      Right Left   Pressure 18 15       Pupils      Pupils Dark Light Shape React APD   Right PERRL 5 4 Irregular Brisk None   Left PERRL 5 4 Round Brisk None       Visual Fields (Counting  fingers)      Left Right    Full Full       Extraocular Movement      Right Left    Full Full       Neuro/Psych    Oriented x3: Yes   Mood/Affect: Normal       Dilation    Left eye: 1.0% Mydriacyl, 2.5% Phenylephrine @ 8:34 AM        Slit Lamp and Fundus Exam    External Exam      Right Left   External Normal Normal       Slit Lamp Exam      Right Left   Lids/Lashes Normal Normal   Conjunctiva/Sclera White and quiet White and quiet   Cornea Clear Clear   Anterior Chamber Deep and quiet Deep and quiet   Iris Round and reactive Round and reactive   Lens Trace Nuclear sclerosis Trace Nuclear sclerosis   Anterior Vitreous Normal Normal       Fundus Exam      Right Left   Posterior Vitreous  Posterior vitreous detachment   Disc  Collaterals, these are not neovascularization   C/D Ratio  0.5   Macula  Normal, no cystoid macular edema   Vessels  Tortuous,, and slightly engorged at the optic nerve.  , Peripheral dot blot hemorrhages are noted temporally and inferiorly largely  along the inferior hemiretinal vein   Periphery  Normal, no holes or tears          IMAGING AND PROCEDURES  Imaging and Procedures for 10/05/20  Color Fundus Photography Optos - OU - Both Eyes       Right Eye Progression has been stable. Disc findings include normal observations. Macula : normal observations. Vessels : normal observations. Periphery : normal observations.   Left Eye Progression has been stable. Macula : epiretinal membrane.        OCT, Retina - OU - Both Eyes       Right Eye Quality was good. Scan locations included subfoveal. Central Foveal Thickness: 275. Progression has been stable. Findings include normal foveal contour, vitreomacular adhesion .   Left Eye Quality was good. Scan locations included subfoveal. Central Foveal Thickness: 323. Progression has worsened. Findings include abnormal foveal contour, epiretinal membrane.   Notes OD normal, vitreomacular adhesion, thus no PVD  OS, with epiretinal membrane new onset since July 2021.  Retinal thickening, no intraretinal fluid nor CME thus no active CRV O with CME we will continue to observe and monitor for worsening of ERM over time                ASSESSMENT/PLAN:  Central serous chorioretinopathy of left eye Small RPE detachment superonasal to the fovea left eye no change over the last 2 years we will continue to observe  Nuclear sclerotic cataract of left eye The nature of cataract was discussed with the patient as well as the elective nature of surgery. The patient was reassured that surgery at a later date does not put the patient at risk for a worse outcome. It was emphasized that the need for surgery is dictated by the patient's quality of life as influenced by the cataract. Patient was instructed to maintain close follow up with their general eye care doctor.  Macular pucker, left eye The nature of macular pucker (epiretinal membrane ERM) was discussed with the patient as well as  threshold criteria for vitrectomy surgery. I explained that in rare cases another surgery is  needed to actually remove a second wrinkle should it regrow.  Most often, the epiretinal membrane and underlying wrinkled internal limiting membrane are removed with the first surgery, to accomplish the goals.   If the operative eye is Phakic (natural lens still present), cataract surgery is often recommended prior to Vitrectomy. This will enable the retina surgeon to have the best view during surgery and the patient to obtain optimal results in the future. Treatment options were discussed.  This is a new finding with secondary retinal thickening but no associated CME.  Thus we will continue to monitor and this may explain the patient's new onset "notable change in quality" of vision left eye.  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  There is no signs of retinal vascular ischemia.  More importantly there is no CME nor NVE thus do not need to render therapy for this condition at this time.      ICD-10-CM   1. Macular pucker, left eye  H35.372 OCT, Retina - OU - Both Eyes  2. Central retinal vein occlusion of left eye, unspecified complication status  99991111 Color Fundus Photography Optos - OU - Both Eyes    OCT, Retina - OU - Both Eyes  3. Central serous chorioretinopathy of left eye  H35.712   4. Nuclear sclerotic cataract of left eye  H25.12     1.  #1 we will observe, early impact on acuity, patient instructed how to monocular assess vision with reading glasses on a weekly or biweekly basis to look for subtle changes.  Should they occur he should contact the office  2.  No change in central retinal vein status, compensated, no active CME nor NVE will observe  3.  Ophthalmic Meds Ordered this visit:  No orders of the defined types were placed in  this encounter.      Return in about 3 months (around 01/03/2021) for dilate, OS, OCT.  There are no Patient Instructions on file for this visit.   Explained the diagnoses, plan, and follow up with the patient and they expressed understanding.  Patient expressed understanding of the importance of proper follow up care.   Clent Demark Darwin Guastella M.D. Diseases & Surgery of the Retina and Vitreous Retina & Diabetic [place] 10/05/20     Abbreviations: M myopia (nearsighted); A astigmatism; H hyperopia (farsighted); P presbyopia; Mrx spectacle prescription;  CTL contact lenses; OD right eye; OS left eye; OU both eyes  XT exotropia; ET esotropia; PEK punctate epithelial keratitis; PEE punctate epithelial erosions; DES dry eye syndrome; MGD meibomian gland dysfunction; ATs artificial tears; PFAT's preservative free artificial tears; [place] nuclear sclerotic cataract; PSC posterior subcapsular cataract; ERM epi-retinal membrane; PVD posterior vitreous detachment; RD retinal detachment; DM diabetes mellitus; DR diabetic retinopathy; NPDR non-proliferative diabetic retinopathy; PDR proliferative diabetic retinopathy; CSME clinically significant macular edema; DME diabetic macular edema; dbh dot blot hemorrhages; CWS cotton wool spot; POAG primary open angle glaucoma; C/D cup-to-disc ratio; HVF humphrey visual field; GVF goldmann visual field; OCT optical coherence tomography; IOP intraocular pressure; BRVO Branch retinal vein occlusion; CRVO central retinal vein occlusion; CRAO central retinal artery occlusion; BRAO branch retinal artery occlusion; RT retinal tear; SB scleral buckle; PPV pars plana vitrectomy; VH Vitreous hemorrhage; PRP panretinal laser photocoagulation; IVK intravitreal kenalog; VMT vitreomacular traction; MH Macular hole;  NVD neovascularization of the disc; NVE neovascularization elsewhere; AREDS age related eye disease  study; ARMD age related macular degeneration; POAG primary open angle  glaucoma; EBMD epithelial/anterior basement membrane dystrophy; ACIOL anterior chamber intraocular lens; IOL intraocular lens; PCIOL posterior chamber intraocular lens; Phaco/IOL phacoemulsification with intraocular lens placement; [place] photorefractive keratectomy; LASIK laser assisted in situ keratomileusis; HTN hypertension; DM diabetes mellitus; COPD chronic obstructive pulmonary disease

## 2021-01-01 ENCOUNTER — Ambulatory Visit (INDEPENDENT_AMBULATORY_CARE_PROVIDER_SITE_OTHER): Payer: Managed Care, Other (non HMO) | Admitting: Ophthalmology

## 2021-01-01 ENCOUNTER — Other Ambulatory Visit: Payer: Self-pay

## 2021-01-01 ENCOUNTER — Encounter (INDEPENDENT_AMBULATORY_CARE_PROVIDER_SITE_OTHER): Payer: Self-pay | Admitting: Ophthalmology

## 2021-01-01 DIAGNOSIS — H2512 Age-related nuclear cataract, left eye: Secondary | ICD-10-CM | POA: Diagnosis not present

## 2021-01-01 DIAGNOSIS — H3562 Retinal hemorrhage, left eye: Secondary | ICD-10-CM

## 2021-01-01 DIAGNOSIS — H35712 Central serous chorioretinopathy, left eye: Secondary | ICD-10-CM

## 2021-01-01 DIAGNOSIS — H35372 Puckering of macula, left eye: Secondary | ICD-10-CM

## 2021-01-01 DIAGNOSIS — H348122 Central retinal vein occlusion, left eye, stable: Secondary | ICD-10-CM

## 2021-01-01 NOTE — Assessment & Plan Note (Signed)
Resolved OS, was related to prior CRV O

## 2021-01-01 NOTE — Assessment & Plan Note (Signed)
Progressive macular pucker left eye, as compared to 3 months previous And completely new as compared to  9 months previous

## 2021-01-01 NOTE — Assessment & Plan Note (Signed)
OS, no active serous retinopathy, small PED superonasal to the fovea remains completely stable

## 2021-01-01 NOTE — Assessment & Plan Note (Signed)
Mild NSC, no impact on acuity

## 2021-01-01 NOTE — Progress Notes (Signed)
01/01/2021     CHIEF COMPLAINT Patient presents for Retina Follow Up (85month fu os//Pt states, " I feel like everything is about the same, although I am noticing that in the morning I feel like I have a blind spot temporally OS.")   HISTORY OF PRESENT ILLNESS: Wesley Simmons is a 60 y.o. male who presents to the clinic today for:   HPI    Retina Follow Up    Patient presents with  CRVO/BRVO.  In left eye.  This started 3 months ago.  Severity is mild.  Duration of 3 months.  Since onset it is stable. Additional comments: 70month fu os  Pt states, " I feel like everything is about the same, although I am noticing that in the morning I feel like I have a blind spot temporally OS."       Last edited by Wesley Simmons, COA on 01/01/2021  8:19 AM. (History)      Referring physician: Josetta Huddle, MD [place]. [address] [place],  [place] [address]  HISTORICAL INFORMATION:   Selected notes from the MEDICAL RECORD NUMBER       CURRENT MEDICATIONS: Current Outpatient Medications (Ophthalmic Drugs)  Medication Sig  . timolol (TIMOPTIC) 0.25 % ophthalmic solution Place 1 drop into the left eye daily.   No current facility-administered medications for this visit. (Ophthalmic Drugs)   Current Outpatient Medications (Other)  Medication Sig  . aspirin 81 MG tablet Take 81 mg by mouth as needed.  . influenza vac split quadrivalent PF (AFLURIA QUADRIVALENT) 0.5 ML injection ADM 0.5ML IM UTD  . Multiple Vitamins-Minerals (CENTRUM SILVER PO) Take 1 tablet by mouth as needed.  . vitamin B-12 (CYANOCOBALAMIN) 1000 MCG tablet Take 1,000 mcg by mouth as needed.   No current facility-administered medications for this visit. (Other)      REVIEW OF SYSTEMS:    ALLERGIES No Known Allergies  PAST MEDICAL HISTORY Past Medical History:  Diagnosis Date  . Allergy    mild congestion  . Arthritis   . Cancer ([place])    basal cell on r. temple  . History of colon polyps   .  Retinal hemorrhage of left eye 01/03/2020  . Upper respiratory infection 08/2017   Past Surgical History:  Procedure Laterality Date  . ACHILLES TENDON REPAIR  2006   right  . COLONOSCOPY    . POLYPECTOMY      FAMILY HISTORY Family History  Problem Relation Age of Onset  . Breast cancer Mother   . Colon polyps Father   . Colon cancer Paternal Grandfather   . Esophageal cancer Neg Hx   . Rectal cancer Neg Hx   . Stomach cancer Neg Hx     SOCIAL HISTORY Social History   Tobacco Use  . Smoking status: Never Smoker  . Smokeless tobacco: Never Used  Vaping Use  . Vaping Use: Never used  Substance Use Topics  . Alcohol use: Yes    Alcohol/week: 3.0 standard drinks    Types: 2 Glasses of wine, 1 Cans of beer per week  . Drug use: No         OPHTHALMIC EXAM:  Base Eye Exam    Visual Acuity (ETDRS)      Right Left   Dist [place] 20/20 20/20       Tonometry (Tonopen, 8:22 AM)      Right Left   Pressure 13 13       Pupils  Dark Light Shape React APD   Right 4 3 Irregular Brisk None   Left 4 3 Round Brisk None       Visual Fields (Counting fingers)      Left Right    Full        Extraocular Movement      Right Left    Full Full       Neuro/Psych    Oriented x3: Yes   Mood/Affect: Normal       Dilation    Left eye: 1.0% Mydriacyl, 2.5% Phenylephrine @ 8:22 AM        Slit Lamp and Fundus Exam    External Exam      Right Left   External Normal Normal       Slit Lamp Exam      Right Left   Lids/Lashes Normal Normal   Conjunctiva/Sclera White and quiet White and quiet   Cornea Clear Clear   Anterior Chamber Deep and quiet Deep and quiet   Iris Round and reactive Round and reactive   Lens Trace Nuclear sclerosis Trace Nuclear sclerosis   Anterior Vitreous Normal Normal       Fundus Exam      Right Left   Posterior Vitreous  Posterior vitreous detachment   Disc  Collaterals, these are not neovascularization   C/D Ratio  0.5   Macula   Normal, no cystoid macular edema   Vessels  Tortuous,, and slightly engorged at the optic nerve.  , Peripheral dot blot hemorrhages are noted temporally and inferiorly largely along the inferior hemiretinal vein   Periphery  Normal, no holes or tears          IMAGING AND PROCEDURES  Imaging and Procedures for 01/01/21  OCT, Retina - OU - Both Eyes       Right Eye Quality was good. Scan locations included subfoveal. Central Foveal Thickness: 277. Progression has been stable. Findings include normal foveal contour, vitreomacular adhesion .   Left Eye Quality was good. Scan locations included subfoveal. Central Foveal Thickness: 363. Progression has worsened. Findings include abnormal foveal contour, epiretinal membrane.   Notes OD normal, vitreomacular adhesion, thus no PVD  OS, with epiretinal membrane new onset since July 2021.  Retinal thickening slightly worse, no intraretinal fluid nor CME thus no active CRV O with CME we will continue to observe and monitor for worsening of ERM over time                ASSESSMENT/PLAN:  Central serous chorioretinopathy of left eye OS, no active serous retinopathy, small PED superonasal to the fovea remains completely stable  Central retinal vein occlusion of left eye Residual collateralization of the optic nerve vessels are seen yet with no neovascularization and no active CME, observe  Macular pucker, left eye Progressive macular pucker left eye, as compared to 3 months previous And completely new as compared to  9 months previous  Nuclear sclerotic cataract of left eye Mild NSC, no impact on acuity  Retinal hemorrhage of left eye Resolved OS, was related to prior CRV O      ICD-10-CM   1. Macular pucker, left eye  H35.372 OCT, Retina - OU - Both Eyes  2. Central serous chorioretinopathy of left eye  H35.712   3. Central retinal vein occlusion of left eye, unspecified complication status  Y19.5093   4. Nuclear sclerotic  cataract of left eye  H25.12   5. Retinal hemorrhage of left eye  H35.62  1.  OS, with minor topographic and a worsening over the last 3 months, likely to worsen but not impactful of vision at this time thus we will observe.  2.  Patient instructed to monocularly assess his reading vision at least once a week in order to look for changes over time.  3.  CRV O OS now compensated, inactive, no complications  Ophthalmic Meds Ordered this visit:  No orders of the defined types were placed in this encounter.      Return in about 6 months (around 07/03/2021) for dilate, OS, OCT.  There are no Patient Instructions on file for this visit.   Explained the diagnoses, plan, and follow up with the patient and they expressed understanding.  Patient expressed understanding of the importance of proper follow up care.   Clent Demark Yuta Cipollone M.D. Diseases & Surgery of the Retina and Vitreous Retina & Diabetic [place] 01/01/21     Abbreviations: M myopia (nearsighted); A astigmatism; H hyperopia (farsighted); P presbyopia; Mrx spectacle prescription;  CTL contact lenses; OD right eye; OS left eye; OU both eyes  XT exotropia; ET esotropia; PEK punctate epithelial keratitis; PEE punctate epithelial erosions; DES dry eye syndrome; MGD meibomian gland dysfunction; ATs artificial tears; PFAT's preservative free artificial tears; [place] nuclear sclerotic cataract; PSC posterior subcapsular cataract; ERM epi-retinal membrane; PVD posterior vitreous detachment; RD retinal detachment; DM diabetes mellitus; DR diabetic retinopathy; NPDR non-proliferative diabetic retinopathy; PDR proliferative diabetic retinopathy; CSME clinically significant macular edema; DME diabetic macular edema; dbh dot blot hemorrhages; CWS cotton wool spot; POAG primary open angle glaucoma; C/D cup-to-disc ratio; HVF humphrey visual field; GVF goldmann visual field; OCT optical coherence tomography; IOP intraocular pressure; BRVO Branch  retinal vein occlusion; CRVO central retinal vein occlusion; CRAO central retinal artery occlusion; BRAO branch retinal artery occlusion; RT retinal tear; SB scleral buckle; PPV pars plana vitrectomy; VH Vitreous hemorrhage; PRP panretinal laser photocoagulation; IVK intravitreal kenalog; VMT vitreomacular traction; MH Macular hole;  NVD neovascularization of the disc; NVE neovascularization elsewhere; AREDS age related eye disease study; ARMD age related macular degeneration; POAG primary open angle glaucoma; EBMD epithelial/anterior basement membrane dystrophy; ACIOL anterior chamber intraocular lens; IOL intraocular lens; PCIOL posterior chamber intraocular lens; Phaco/IOL phacoemulsification with intraocular lens placement; [place] photorefractive keratectomy; LASIK laser assisted in situ keratomileusis; HTN hypertension; DM diabetes mellitus; COPD chronic obstructive pulmonary disease

## 2021-01-01 NOTE — Assessment & Plan Note (Signed)
Residual collateralization of the optic nerve vessels are seen yet with no neovascularization and no active CME, observe

## 2021-05-23 ENCOUNTER — Encounter (INDEPENDENT_AMBULATORY_CARE_PROVIDER_SITE_OTHER): Payer: Self-pay | Admitting: Ophthalmology

## 2021-07-03 ENCOUNTER — Encounter (INDEPENDENT_AMBULATORY_CARE_PROVIDER_SITE_OTHER): Payer: Self-pay | Admitting: Ophthalmology

## 2021-07-03 ENCOUNTER — Ambulatory Visit (INDEPENDENT_AMBULATORY_CARE_PROVIDER_SITE_OTHER): Payer: Managed Care, Other (non HMO) | Admitting: Ophthalmology

## 2021-07-03 ENCOUNTER — Other Ambulatory Visit: Payer: Self-pay

## 2021-07-03 DIAGNOSIS — H35372 Puckering of macula, left eye: Secondary | ICD-10-CM

## 2021-07-03 DIAGNOSIS — H2512 Age-related nuclear cataract, left eye: Secondary | ICD-10-CM | POA: Diagnosis not present

## 2021-07-03 NOTE — Assessment & Plan Note (Signed)
OS mild to moderate cataract, current lens opacification and color, do not preclude vitrectomy membrane peel should the epiretinal membrane worsen.  However if current color and/or opacity worsens beyond its current stage, may need to have cataract surgery in the left eye first, reassess acuity and if acuity still hampers activities of daily living, particularly reading, within proceed with vitrectomy membrane peel left eye

## 2021-07-03 NOTE — Progress Notes (Signed)
07/03/2021     CHIEF COMPLAINT Patient presents for  Chief Complaint  Patient presents with   Retina Follow Up      HISTORY OF PRESENT ILLNESS: Wesley Simmons is a 60 y.o. male who presents to the clinic today for:   HPI     Retina Follow Up   Patient presents with  Other.  In left eye.  This started 6 months ago.  Duration of 6 months.  Since onset it is gradually worsening.        Comments   6 month f/u OS with OCT.  Vision fluctuates from day to day.  OD seems to be doing "all the work"      Last edited by Hurman Horn, MD on 07/03/2021  8:32 AM.      Referring physician: Josetta Huddle, MD [address] [place],  [place] [id number]  HISTORICAL INFORMATION:   Selected notes from the [facility]: No current outpatient medications on file. (Ophthalmic Drugs)   No current facility-administered medications for this visit. (Ophthalmic Drugs)   Current Outpatient Medications (Other)  Medication Sig   aspirin 81 MG tablet Take 81 mg by mouth as needed.   influenza vac split quadrivalent PF (AFLURIA QUADRIVALENT) 0.5 ML injection ADM 0.5ML IM UTD   Multiple Vitamins-Minerals (CENTRUM SILVER PO) Take 1 tablet by mouth as needed.   vitamin B-12 (CYANOCOBALAMIN) 1000 MCG tablet Take 1,000 mcg by mouth as needed.   No current facility-administered medications for this visit. 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   History of colon polyps    Hypercholesteremia    Retinal hemorrhage of left eye 01/03/2020   Right corneal abrasion 01/03/2020   Upper respiratory infection 08/2017   Past Surgical History:  Procedure Laterality Date   ACHILLES TENDON REPAIR  2006   right   COLONOSCOPY     POLYPECTOMY      FAMILY  HISTORY Family History  Problem Relation Age of Onset   Breast cancer Mother    Colon polyps Father    Colon cancer Paternal Grandfather    Esophageal cancer Neg Hx    Rectal cancer Neg Hx    Stomach cancer Neg Hx     SOCIAL HISTORY Social History   Tobacco Use   Smoking status: Never   Smokeless tobacco: Never  Vaping Use   Vaping Use: Never used  Substance Use Topics   Alcohol use: Yes    Alcohol/week: 3.0 standard drinks    Types: 2 Glasses of wine, 1 Cans of beer per week   Drug use: No         OPHTHALMIC EXAM:  Base Eye Exam     Visual Acuity (ETDRS)       Right Left   Dist Tok 20/20 20/20         Tonometry (Tonopen, 8:11 AM)       Right Left   Pressure 11 10         Pupils       Pupils Dark Light Shape React APD   Right PERRL 6 5 Round Brisk None   Left PERRL 6 5 Round Brisk None  Visual Fields (Counting fingers)       Left Right    Full Full         Extraocular Movement       Right Left    Full, Ortho Full, Ortho         Neuro/Psych     Oriented x3: Yes   Mood/Affect: Normal         Dilation     Left eye: 1.0% Mydriacyl, 2.5% Phenylephrine @ 8:11 AM           Slit Lamp and Fundus Exam     External Exam       Right Left   External Normal Normal         Slit Lamp Exam       Right Left   Lids/Lashes Normal Normal   Conjunctiva/Sclera White and quiet White and quiet   Cornea Clear Clear   Anterior Chamber Deep and quiet Deep and quiet   Iris Round and reactive Round and reactive   Lens Trace Nuclear sclerosis 1+ Nuclear sclerosis   Anterior Vitreous Normal Normal         Fundus Exam       Right Left   Posterior Vitreous  Posterior vitreous detachment   Disc  Collaterals, these are not neovascularization   C/D Ratio  0.5   Macula   no cystoid macular edema, Epiretinal membrane, moderate topo    Vessels  Tortuous,, and slightly engorged at the optic nerve.  , Peripheral dot blot  hemorrhages are noted temporally and inferiorly largely along the inferior hemiretinal vein   Periphery  Normal, no holes or tears            IMAGING AND PROCEDURES  Imaging and Procedures for 07/03/21  OCT, Retina - OU - Both Eyes       Right Eye Quality was good. Scan locations included subfoveal. Central Foveal Thickness: 276. Progression has been stable. Findings include normal foveal contour, vitreomacular adhesion .   Left Eye Quality was good. Scan locations included subfoveal. Central Foveal Thickness: 410. Progression has worsened. Findings include abnormal foveal contour, epiretinal membrane.   Notes OD normal, vitreomacular adhesion, thus no PVD  OS, with epiretinal membrane new onset since July 2021.  Retinal thickening moderately worse, no intraretinal fluid nor CME thus no active CRV O with CME we will continue to observe and monitor for worsening of ERM over time             ASSESSMENT/PLAN:  Nuclear sclerotic cataract of left eye OS mild to moderate cataract, current lens opacification and color, do not preclude vitrectomy membrane peel should the epiretinal membrane worsen.  However if current color and/or opacity worsens beyond its current stage, may need to have cataract surgery in the left eye first, reassess acuity and if acuity still hampers activities of daily living, particularly reading, within proceed with vitrectomy membrane peel left eye  Macular pucker, left eye The nature of macular pucker (epiretinal membrane ERM) was discussed with the patient as well as threshold criteria for vitrectomy surgery. I explained that in rare cases another surgery is needed to actually remove a second wrinkle should it regrow.  Most often, the epiretinal membrane and underlying wrinkled internal limiting membrane are removed with the first surgery, to accomplish the goals.   If the operative eye is Phakic (natural lens still present), cataract surgery is often  recommended prior to Vitrectomy. This will enable the retina surgeon to have the  best view during surgery and the patient to obtain optimal results in the future. Treatment options were discussed.  I have recommended at home monitoring the near vision task in a monocular (1 eye at a time), with or without near vision glasses, to look for changes or declines in reading.  OS with progression of macular thickening from the epiretinal membrane as compared to 6 months prior.  Patient also now noticing that the left eye "not as good as the" in the right eye "carrying all the strain"  I reviewed the findings and the OCT with the patient, and then explained to the patient that the right eye is not ever strained from the condition of the left eye.  I explained to the patient that vitrectomy membrane peel the left eye should be undertaken when activities of daily living can not being met when the left eye is used in a so low, alone type fashion.  For instance I have asked the patient at home to use reading glasses at home and to monitor the left eye on a single eye, monocular basis, by using material that he has that is around the house that he can read with his glasses today and compared to 1 week 1 month and 6 months in the future.  If the left eye does not decline with that reading ability, I still recommend observation of this current condition and intervention only when the left eye can no longer read in a solo fashion     ICD-10-CM   1. Macular pucker, left eye  H35.372 OCT, Retina - OU - Both Eyes    2. Nuclear sclerotic cataract of left eye  H25.12       1.  OS with progression of macular pucker and retinal thickening however no intraretinal fluid and no outer retinal distortion of the photoreceptor layer from this epiretinal membrane.  For this reason it is appropriate to continue to monitor visual acuity in a monocular fashion on a weekly or monthly basis at home.  Should the left eye deteriorate  and visual quality to the point that reading is not Possible, but we could reassess and determine whether another type prevention might be appropriate such as surgery.  2.  3.  Ophthalmic Meds Ordered this visit:  No orders of the defined types were placed in this encounter.      Return in about 6 months (around 01/01/2022) for DILATE OU, OCT.  There are no Patient Instructions on file for this visit.   Explained the diagnoses, plan, and follow up with the patient and they expressed understanding.  Patient expressed understanding of the importance of proper follow up care.   Clent Demark Urijah Arko M.D. Diseases & Surgery of the Retina and Vitreous [facility] 07/03/21     Abbreviations: M myopia (nearsighted); A astigmatism; H hyperopia (farsighted); P presbyopia; Mrx spectacle prescription;  CTL contact lenses; OD right eye; OS left eye; OU both eyes  XT exotropia; ET esotropia; PEK punctate epithelial keratitis; PEE punctate epithelial erosions; DES dry eye syndrome; MGD meibomian gland dysfunction; ATs artificial tears; PFAT's preservative free artificial tears; [place] nuclear sclerotic cataract; PSC posterior subcapsular cataract; ERM epi-retinal membrane; PVD posterior vitreous detachment; RD retinal detachment; DM diabetes mellitus; DR diabetic retinopathy; NPDR non-proliferative diabetic retinopathy; PDR proliferative diabetic retinopathy; CSME clinically significant macular edema; DME diabetic macular edema; dbh dot blot hemorrhages; CWS cotton wool spot; POAG primary open angle glaucoma; C/D cup-to-disc ratio; HVF humphrey visual field; GVF  goldmann visual field; OCT optical coherence tomography; IOP intraocular pressure; BRVO Branch retinal vein occlusion; CRVO central retinal vein occlusion; CRAO central retinal artery occlusion; BRAO branch retinal artery occlusion; RT retinal tear; SB scleral buckle; PPV pars plana vitrectomy; VH Vitreous hemorrhage; PRP panretinal laser  photocoagulation; IVK intravitreal kenalog; VMT vitreomacular traction; MH Macular hole;  NVD neovascularization of the disc; NVE neovascularization elsewhere; AREDS age related eye disease study; ARMD age related macular degeneration; POAG primary open angle glaucoma; EBMD epithelial/anterior basement membrane dystrophy; ACIOL anterior chamber intraocular lens; IOL intraocular lens; PCIOL posterior chamber intraocular lens; Phaco/IOL phacoemulsification with intraocular lens placement; Blanco photorefractive keratectomy; LASIK laser assisted in situ keratomileusis; HTN hypertension; DM diabetes mellitus; COPD chronic obstructive pulmonary disease

## 2021-07-03 NOTE — Assessment & Plan Note (Signed)
The nature of macular pucker (epiretinal membrane ERM) was discussed with the patient as well as threshold criteria for vitrectomy surgery. I explained that in rare cases another surgery is needed to actually remove a second wrinkle should it regrow.  Most often, the epiretinal membrane and underlying wrinkled internal limiting membrane are removed with the first surgery, to accomplish the goals.   If the operative eye is Phakic (natural lens still present), cataract surgery is often recommended prior to Vitrectomy. This will enable the retina surgeon to have the best view during surgery and the patient to obtain optimal results in the future. Treatment options were discussed.  I have recommended at home monitoring the near vision task in a monocular (1 eye at a time), with or without near vision glasses, to look for changes or declines in reading.  OS with progression of macular thickening from the epiretinal membrane as compared to 6 months prior.  Patient also now noticing that the left eye "not as good as the" in the right eye "carrying all the strain"  I reviewed the findings and the OCT with the patient, and then explained to the patient that the right eye is not ever strained from the condition of the left eye.  I explained to the patient that vitrectomy membrane peel the left eye should be undertaken when activities of daily living can not being met when the left eye is used in a so low, alone type fashion.  For instance I have asked the patient at home to use reading glasses at home and to monitor the left eye on a single eye, monocular basis, by using material that he has that is around the house that he can read with his glasses today and compared to 1 week 1 month and 6 months in the future.  If the left eye does not decline with that reading ability, I still recommend observation of this current condition and intervention only when the left eye can no longer read in a solo fashion

## 2022-01-01 ENCOUNTER — Encounter (INDEPENDENT_AMBULATORY_CARE_PROVIDER_SITE_OTHER): Payer: Self-pay | Admitting: Ophthalmology

## 2022-01-01 ENCOUNTER — Ambulatory Visit (INDEPENDENT_AMBULATORY_CARE_PROVIDER_SITE_OTHER): Payer: Managed Care, Other (non HMO) | Admitting: Ophthalmology

## 2022-01-01 DIAGNOSIS — H35372 Puckering of macula, left eye: Secondary | ICD-10-CM

## 2022-01-01 DIAGNOSIS — H2511 Age-related nuclear cataract, right eye: Secondary | ICD-10-CM

## 2022-01-01 DIAGNOSIS — H348122 Central retinal vein occlusion, left eye, stable: Secondary | ICD-10-CM

## 2022-01-01 DIAGNOSIS — H2512 Age-related nuclear cataract, left eye: Secondary | ICD-10-CM | POA: Diagnosis not present

## 2022-01-01 DIAGNOSIS — H35712 Central serous chorioretinopathy, left eye: Secondary | ICD-10-CM | POA: Diagnosis not present

## 2022-01-01 NOTE — Assessment & Plan Note (Signed)
Treated in the past now collateralized stable no macular edema ?

## 2022-01-01 NOTE — Assessment & Plan Note (Signed)
History of recurrence 2015, resolved ?

## 2022-01-01 NOTE — Progress Notes (Signed)
? ? ?01/01/2022 ? ?  ? ?CHIEF COMPLAINT ?Patient presents for  ?Chief Complaint  ?Patient presents with  ? Retina Follow Up  ? ? ? ? ?HISTORY OF PRESENT ILLNESS: ?Wesley Simmons is a 61 y.o. male who presents to the clinic today for:  ? ?HPI   ? ? Retina Follow Up   ? ?      ? Diagnosis: Other  ? Laterality: left eye  ? Severity: moderate  ? Course: gradually worsening  ? ?  ?  ? ? Comments   ?6 mos FU OU OCT. ?Pt states, "there are some days where my vision is good or bad. I feel like I have to use my cheaters all the time to read fine print. My left eye, which is my problem eye I feel like it's fuzzy. I am able to see from a distance and drive but when I am on  the computer doing work, my vision gets a little fuzzy." ?Pt denies floaters and FOL. ? ? ? ? ?  ?  ?Last edited by Silvestre Moment on 01/01/2022  8:19 AM.  ?  ? ? ?Referring physician: ?Josetta Huddle, MD ?[place] ?[address] ?[place],  [place] [address] ? ?HISTORICAL INFORMATION:  ? ?Selected notes from the [facility] ?  ?   ? ?CURRENT MEDICATIONS: ?No current outpatient medications on file. (Ophthalmic Drugs)  ? ?No current facility-administered medications for this visit. (Ophthalmic Drugs)  ? ?Current Outpatient Medications (Other)  ?Medication Sig  ? aspirin 81 MG tablet Take 81 mg by mouth as needed.  ? influenza vac split quadrivalent PF (AFLURIA QUADRIVALENT) 0.5 ML injection ADM 0.5ML IM UTD  ? Multiple Vitamins-Minerals (CENTRUM SILVER PO) Take 1 tablet by mouth as needed.  ? vitamin B-12 (CYANOCOBALAMIN) 1000 MCG tablet Take 1,000 mcg by mouth as needed.  ? ?No current facility-administered medications for this visit. (Other)  ? ? ? ? ?REVIEW OF SYSTEMS: ?ROS   ?Negative for: Constitutional, Gastrointestinal, Neurological, Skin, Genitourinary, Musculoskeletal, HENT, Endocrine, Cardiovascular, Eyes, Respiratory, Psychiatric, Allergic/Imm, Heme/Lymph ?Last edited by Silvestre Moment on 01/01/2022  8:19 AM.  ?  ? ? ? ?ALLERGIES ?No Known  Allergies ? ?PAST MEDICAL HISTORY ?Past Medical History:  ?Diagnosis Date  ? Allergy   ? mild congestion  ? Arthritis   ? Cancer [facility])   ? 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 History of colon polyps   ? Hypercholesteremia   ? Retinal hemorrhage of left eye 01/03/2020  ? Right corneal abrasion 01/03/2020  ? Upper respiratory infection 08/2017  ? ?Past Surgical History:  ?Procedure Laterality Date  ? ACHILLES TENDON REPAIR  2006  ? right  ? COLONOSCOPY    ? POLYPECTOMY    ? ? ?FAMILY HISTORY ?Family History  ?Problem Relation Age of Onset  ? Breast cancer Mother   ? Colon polyps Father   ? Colon cancer Paternal Grandfather   ? Esophageal cancer Neg Hx   ? Rectal cancer Neg Hx   ? Stomach cancer Neg Hx   ? ? ?SOCIAL HISTORY ?Social History  ? ?Tobacco Use  ? Smoking status: Never  ? Smokeless tobacco: Never  ?Vaping Use  ? Vaping Use: Never used  ?Substance Use Topics  ? Alcohol use: Yes  ?  Alcohol/week: 3.0 standard drinks  ?  Types: 2 Glasses of wine, 1 Cans of beer per week  ? Drug use: No  ? ?  ? ?  ? ?OPHTHALMIC EXAM: ? ?Base Eye  Exam   ? ? Visual Acuity (ETDRS)   ? ?   Right Left  ? Dist [place] 20/20 20/20 -1  ? ?  ?  ? ? Tonometry (Tonopen, 8:24 AM)   ? ?   Right Left  ? Pressure 18 13  ? ?  ?  ? ? Pupils   ? ?   Pupils APD  ? Right PERRL None  ? Left PERRL   ? ?  ?  ? ? Visual Fields   ? ?   Left Right  ?  Full Full  ? ?  ?  ? ? Extraocular Movement   ? ?   Right Left  ?  Full Full  ? ?  ?  ? ? Neuro/Psych   ? ? Oriented x3: Yes  ? Mood/Affect: Normal  ? ?  ?  ? ? Dilation   ? ? Both eyes: 1.0% Mydriacyl, 2.5% Phenylephrine @ 8:24 AM  ? ?  ?  ? ?  ? ?Slit Lamp and Fundus Exam   ? ? External Exam   ? ?   Right Left  ? External Normal Normal  ? ?  ?  ? ? Slit Lamp Exam   ? ?   Right Left  ? Lids/Lashes Normal Normal  ? Conjunctiva/Sclera White and quiet White and quiet  ? Cornea Clear Clear  ? Anterior Chamber Deep and quiet Deep and quiet  ? Iris Round and reactive Round and reactive  ? Lens Trace Nuclear sclerosis 1+  Nuclear sclerosis  ? Anterior Vitreous Normal Normal  ? ?  ?  ? ? Fundus Exam   ? ?   Right Left  ? Posterior Vitreous Normal Posterior vitreous detachment  ? Disc Normal Collaterals, these are not neovascularization  ? C/D Ratio 0.5 0.5  ? Macula Normal  no cystoid macular edema, Epiretinal membrane, moderate topo   ? Vessels Normal Tortuous,, and slightly engorged at the optic nerve.  , Peripheral dot blot hemorrhages are noted temporally and inferiorly largely along the inferior hemiretinal vein  ? Periphery Normal Normal, no holes or tears  ? ?  ?  ? ?  ? ? ?IMAGING AND PROCEDURES  ?Imaging and Procedures for 01/01/22 ? ?OCT, Retina - OU - Both Eyes   ? ?   ?Right Eye ?Quality was good. Scan locations included subfoveal. Central Foveal Thickness: 276. Progression has been stable. Findings include normal foveal contour, vitreomacular adhesion .  ? ?Left Eye ?Quality was good. Scan locations included subfoveal. Central Foveal Thickness: 410. Progression has worsened. Findings include abnormal foveal contour, epiretinal membrane.  ? ?Notes ?OD normal, vitreomacular adhesion, thus no PVD ? ?OS, with epiretinal membrane new onset since July 2021.  Retinal thickening moderately worse, no intraretinal fluid nor CME thus no active CRV O with CME we will continue to observe and monitor for worsening of ERM over time, with no change and no disturbance to the outer photoreceptor layer, no intraretinal fluid or CME we will continue to observe ? ?  ? ? ?  ?  ? ?  ?ASSESSMENT/PLAN: ? ?Nuclear sclerotic cataract of right eye ?Trace no impact on acuity ? ?Nuclear sclerotic cataract of left eye ?Trace to mild no impact on acuity ? ?Macular pucker, left eye ?OS, slight increase in thickness centrally but no impact on acuity substantially.  Patient is slightly symptomatic at with good acuity will recommend continued observation ? ?Central serous chorioretinopathy of left eye ?History of recurrence 2015, resolved ? ?  Central retinal  vein occlusion of left eye ?Treated in the past now collateralized stable no macular edema  ? ?  ICD-10-CM   ?1. Macular pucker, left eye  H35.372 OCT, Retina - OU - Both Eyes  ?  ?2. Nuclear sclerotic cataract of right eye  H25.11   ?  ?3. Nuclear sclerotic cataract of left eye  H25.12   ?  ?4. Central serous chorioretinopathy of left eye  H35.712   ?  ?5. Stable central retinal vein occlusion of left eye  H34.8122   ?  ? ? ?1.  OS we will follow-up again in 6 months simply to confirm that the epiretinal membrane triggers no impact on long-term visual functioning OS. ?2. ? ?3. ? ?Ophthalmic Meds Ordered this visit:  ?No orders of the defined types were placed in this encounter. ? ? ?  ? ?Return in about 6 months (around 07/03/2022) for dilate, OS, OCT. ? ?There are no Patient Instructions on file for this visit. ? ? ?Explained the diagnoses, plan, and follow up with the patient and they expressed understanding.  Patient expressed understanding of the importance of proper follow up care.  ? ?Clent Demark. Namira Rosekrans M.D. ?Diseases & Surgery of the Retina and Vitreous ?[facility] ?01/01/22 ? ? ? ? ?Abbreviations: ?M myopia (nearsighted); A astigmatism; H hyperopia (farsighted); P presbyopia; Mrx spectacle prescription;  CTL contact lenses; OD right eye; OS left eye; OU both eyes  XT exotropia; ET esotropia; PEK punctate epithelial keratitis; PEE punctate epithelial erosions; DES dry eye syndrome; MGD meibomian gland dysfunction; ATs artificial tears; PFAT's preservative free artificial tears; [place] nuclear sclerotic cataract; PSC posterior subcapsular cataract; ERM epi-retinal membrane; PVD posterior vitreous detachment; RD retinal detachment; DM diabetes mellitus; DR diabetic retinopathy; NPDR non-proliferative diabetic retinopathy; PDR proliferative diabetic retinopathy; CSME clinically significant macular edema; DME diabetic macular edema; dbh dot blot hemorrhages; CWS cotton wool spot; POAG primary open  angle glaucoma; C/D cup-to-disc ratio; HVF humphrey visual field; GVF goldmann visual field; OCT optical coherence tomography; IOP intraocular pressure; BRVO Branch retinal vein occlusion; CRVO central retinal vein occlusio

## 2022-01-01 NOTE — Assessment & Plan Note (Signed)
OS, slight increase in thickness centrally but no impact on acuity substantially.  Patient is slightly symptomatic at with good acuity will recommend continued observation ?

## 2022-01-01 NOTE — Assessment & Plan Note (Signed)
Trace to mild no impact on acuity ?

## 2022-01-01 NOTE — Assessment & Plan Note (Signed)
Trace no impact on acuity ?

## 2022-05-19 ENCOUNTER — Encounter (INDEPENDENT_AMBULATORY_CARE_PROVIDER_SITE_OTHER): Payer: Self-pay | Admitting: Ophthalmology

## 2022-05-21 ENCOUNTER — Encounter (INDEPENDENT_AMBULATORY_CARE_PROVIDER_SITE_OTHER): Payer: Managed Care, Other (non HMO) | Admitting: Ophthalmology

## 2022-07-03 ENCOUNTER — Encounter (INDEPENDENT_AMBULATORY_CARE_PROVIDER_SITE_OTHER): Payer: Managed Care, Other (non HMO) | Admitting: Ophthalmology

## 2024-02-05 ENCOUNTER — Other Ambulatory Visit (INDEPENDENT_AMBULATORY_CARE_PROVIDER_SITE_OTHER): Payer: Self-pay

## 2024-02-05 ENCOUNTER — Ambulatory Visit: Admitting: Orthopedic Surgery

## 2024-02-05 DIAGNOSIS — M25532 Pain in left wrist: Secondary | ICD-10-CM

## 2024-02-05 DIAGNOSIS — M25551 Pain in right hip: Secondary | ICD-10-CM | POA: Diagnosis not present

## 2024-02-05 DIAGNOSIS — M5410 Radiculopathy, site unspecified: Secondary | ICD-10-CM | POA: Diagnosis not present

## 2024-02-09 ENCOUNTER — Encounter: Payer: Self-pay | Admitting: Orthopedic Surgery

## 2024-02-09 NOTE — Progress Notes (Signed)
 Office Visit Note   Patient: Wesley Simmons           Date of Birth: [birth year]           MRN: [id number] Visit Date: 02/05/2024              Requested by: No referring provider defined for this encounter. PCP: Berta Brittle, MD (Inactive)  Chief Complaint  Patient presents with   Left Wrist - Pain   Right Hip - Pain      HPI: Patient is a 63 year old gentleman who presents with left wrist and right hip pain.  Patient was states he was drilling a hole in concrete when he hit steel and this causes a twisting of his wrist.  Patient has ulnar-sided wrist pain with supination pronation.  The wrist pain started in February.  Patient states he has been having right-sided hip pain with pain reproduced with sleeping on his back and side.  Patient denies any groin pain.  Patient states the pain is the lateral side of the right hip.  No pain radiating down the foot or ankle.  Assessment & Plan: Visit Diagnoses:  1. Pain in left wrist   2. Pain in right hip   3. Radicular pain of right lower extremity     Plan: Discussed that we could proceed with an MRI scan of the wrist if his symptoms persist.  Discussed that the hip pain is most likely a combination of degenerative disc disease of the lumbar spine as well as arthritis of the right hip.  Would proceed with further evaluation if symptoms worsen.  Follow-Up Instructions: Return if symptoms worsen or fail to improve.   Ortho Exam  Patient is alert, oriented, no adenopathy, well-dressed, normal affect, normal respiratory effort. Examination of the wrist the scaphoid scapholunate and TFCC are not tender to palpation.  Ulnar grind is negative the first dorsal extensor compartment is not tender to palpation.  Patient's symptoms are primarily over the TFCC but not symptomatic with examination.  Examination of right hip patient does have decreased range of motion of the right hip but pain is not reproduced with terminal range of motion.  There  is a negative straight leg raise.  There is a normal gait.  Imaging: No results found. No images are attached to the encounter.  Labs: No results found for: "HGBA1C", "ESRSEDRATE", "CRP", "LABURIC", "REPTSTATUS", "GRAMSTAIN", "CULT", "LABORGA"   No results found for: "ALBUMIN", "PREALBUMIN", "CBC"  No results found for: "MG" No results found for: "VD25OH"  No results found for: "PREALBUMIN"     No data to display           There is no height or weight on file to calculate BMI.  Orders:  Orders Placed This Encounter  Procedures   XR Wrist 2 Views Left   XR HIP UNILAT W OR W/O PELVIS 2-3 VIEWS RIGHT   No orders of the defined types were placed in this encounter.    Procedures: No procedures performed  Clinical Data: No additional findings.  ROS:  All other systems negative, except as noted in the HPI. Review of Systems  Objective: Vital Signs: There were no vitals taken for this visit.  Specialty Comments:  No specialty comments available.  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   History of colon polyps    Hypercholesteremia    Retinal hemorrhage of left eye 01/03/2020   Right corneal abrasion 01/03/2020   Upper respiratory infection 08/2017    Family History  Problem Relation Age of Onset   Breast cancer Mother    Colon polyps Father    Colon cancer Paternal Grandfather    Esophageal cancer Neg Hx    Rectal cancer Neg Hx    Stomach cancer Neg Hx     Past  Surgical History:  Procedure Laterality Date   ACHILLES TENDON REPAIR  2006   right   COLONOSCOPY     POLYPECTOMY     Social History   Occupational History   Not on file  Tobacco Use   Smoking status: Never   Smokeless tobacco: Never  Vaping Use   Vaping status: Never Used  Substance and Sexual Activity   Alcohol use: Yes    Alcohol/week: 3.0 standard drinks of alcohol    Types: 2 Glasses of wine, 1 Cans of beer per week   Drug use: No   Sexual activity: Not on file

## 2024-03-29 ENCOUNTER — Other Ambulatory Visit (HOSPITAL_COMMUNITY): Payer: Self-pay | Admitting: Internal Medicine

## 2024-03-29 DIAGNOSIS — I7090 Unspecified atherosclerosis: Secondary | ICD-10-CM

## 2024-04-29 ENCOUNTER — Ambulatory Visit (HOSPITAL_COMMUNITY)
Admission: RE | Admit: 2024-04-29 | Discharge: 2024-04-29 | Disposition: A | Discharge status: Home / Self Care | Payer: Self-pay | Source: Ambulatory Visit | Attending: Internal Medicine | Admitting: Internal Medicine

## 2024-04-29 DIAGNOSIS — I7090 Unspecified atherosclerosis: Secondary | ICD-10-CM | POA: Insufficient documentation

## 2024-07-19 ENCOUNTER — Encounter: Payer: Self-pay | Admitting: Radiology
